# Patient Record
Sex: Male | Born: 1965 | Race: White | Hispanic: No | Marital: Single | Smoking: Current every day smoker
Health system: Southern US, Community
[De-identification: ages and names within clinical notes are randomized; demographics above are authoritative.]

---

## 2004-11-27 ENCOUNTER — Emergency Department (HOSPITAL_COMMUNITY): Admission: EM | Admit: 2004-11-27 | Discharge: 2004-11-27 | Payer: Self-pay | Admitting: Emergency Medicine

## 2006-04-22 ENCOUNTER — Emergency Department (HOSPITAL_COMMUNITY): Admission: EM | Admit: 2006-04-22 | Discharge: 2006-04-22 | Payer: Self-pay | Admitting: Emergency Medicine

## 2009-11-01 ENCOUNTER — Inpatient Hospital Stay (HOSPITAL_COMMUNITY): Admission: EM | Admit: 2009-11-01 | Discharge: 2009-11-03 | Payer: Self-pay | Admitting: Emergency Medicine

## 2010-07-12 LAB — COMPREHENSIVE METABOLIC PANEL
ALT: 16 U/L (ref 0–53)
AST: 20 U/L (ref 0–37)
Albumin: 2.8 g/dL — ABNORMAL LOW (ref 3.5–5.2)
Alkaline Phosphatase: 72 U/L (ref 39–117)
Calcium: 8.1 mg/dL — ABNORMAL LOW (ref 8.4–10.5)
Chloride: 106 mEq/L (ref 96–112)
Chloride: 107 mEq/L (ref 96–112)
Creatinine, Ser: 0.72 mg/dL (ref 0.4–1.5)
GFR calc Af Amer: >60 mL/min (ref >60)
GFR calc non Af Amer: >60 mL/min (ref >60)
Glucose, Bld: 98 mg/dL (ref 70–99)
Potassium: 3.3 mEq/L — ABNORMAL LOW (ref 3.5–5.1)
Sodium: 139 mEq/L (ref 135–145)
Total Bilirubin: 0.5 mg/dL (ref 0.3–1.2)
Total Protein: 5.5 g/dL — ABNORMAL LOW (ref 6.0–8.3)
Total Protein: 7.1 g/dL (ref 6.0–8.3)

## 2010-07-12 LAB — CBC
HCT: 30.5 % — ABNORMAL LOW (ref 39.0–52.0)
HCT: 37.8 % — ABNORMAL LOW (ref 39.0–52.0)
HCT: 42.8 % (ref 39.0–52.0)
Hemoglobin: 10.5 g/dL — ABNORMAL LOW (ref 13.0–17.0)
Hemoglobin: 10.6 g/dL — ABNORMAL LOW (ref 13.0–17.0)
Hemoglobin: 15 g/dL (ref 13.0–17.0)
MCH: 35.3 pg — ABNORMAL HIGH (ref 26.0–34.0)
MCHC: 34.4 g/dL (ref 30.0–36.0)
MCHC: 34.9 g/dL (ref 30.0–36.0)
MCHC: 34.9 g/dL (ref 30.0–36.0)
MCHC: 35.5 g/dL (ref 30.0–36.0)
MCV: 100.4 fL — ABNORMAL HIGH (ref 78.0–100.0)
MCV: 100.9 fL — ABNORMAL HIGH (ref 78.0–100.0)
MCV: 101.2 fL — ABNORMAL HIGH (ref 78.0–100.0)
Platelets: 167 10*3/uL (ref 150–400)
Platelets: 198 10*3/uL (ref 150–400)
Platelets: 208 10*3/uL (ref 150–400)
RBC: 3.73 MIL/uL — ABNORMAL LOW (ref 4.22–5.81)
RBC: 4.27 MIL/uL (ref 4.22–5.81)
RDW: 13.2 % (ref 11.5–15.5)
RDW: 13.3 % (ref 11.5–15.5)
RDW: 13.4 % (ref 11.5–15.5)
WBC: 12.3 10*3/uL — ABNORMAL HIGH (ref 4.0–10.5)

## 2010-07-12 LAB — TYPE AND SCREEN: Antibody Screen: NEGATIVE

## 2010-07-12 LAB — POCT I-STAT, CHEM 8
Calcium, Ion: 1.02 mmol/L — ABNORMAL LOW (ref 1.12–1.32)
TCO2: 23 mmol/L (ref 0–100)

## 2010-07-12 LAB — BASIC METABOLIC PANEL
Calcium: 7.7 mg/dL — ABNORMAL LOW (ref 8.4–10.5)
Chloride: 106 mEq/L (ref 96–112)
Glucose, Bld: 142 mg/dL — ABNORMAL HIGH (ref 70–99)
Potassium: 3.8 mEq/L (ref 3.5–5.1)
Sodium: 136 mEq/L (ref 135–145)

## 2010-07-12 LAB — URINALYSIS, ROUTINE W REFLEX MICROSCOPIC
Bilirubin Urine: NEGATIVE
Glucose, UA: NEGATIVE mg/dL
pH: 5.5 (ref 5.0–8.0)

## 2010-07-12 LAB — APTT: aPTT: 29 seconds (ref 24–37)

## 2010-07-12 LAB — URINE MICROSCOPIC-ADD ON

## 2010-07-12 LAB — LACTIC ACID, PLASMA: Lactic Acid, Venous: 2 mmol/L (ref 0.5–2.2)

## 2010-07-12 LAB — ETHANOL: Alcohol, Ethyl (B): 229 mg/dL — ABNORMAL HIGH (ref 0–10)

## 2010-07-12 LAB — MRSA PCR SCREENING: MRSA by PCR: NEGATIVE

## 2012-12-27 ENCOUNTER — Emergency Department (HOSPITAL_COMMUNITY)
Admission: EM | Admit: 2012-12-27 | Discharge: 2012-12-27 | Disposition: A | Discharge status: Home / Self Care | Payer: Self-pay | Attending: Emergency Medicine | Admitting: Emergency Medicine

## 2012-12-27 ENCOUNTER — Encounter (HOSPITAL_COMMUNITY): Payer: Self-pay

## 2012-12-27 DIAGNOSIS — L0291 Cutaneous abscess, unspecified: Secondary | ICD-10-CM

## 2012-12-27 DIAGNOSIS — L02419 Cutaneous abscess of limb, unspecified: Secondary | ICD-10-CM | POA: Insufficient documentation

## 2012-12-27 DIAGNOSIS — IMO0002 Reserved for concepts with insufficient information to code with codable children: Secondary | ICD-10-CM | POA: Insufficient documentation

## 2012-12-27 DIAGNOSIS — R21 Rash and other nonspecific skin eruption: Secondary | ICD-10-CM | POA: Insufficient documentation

## 2012-12-27 DIAGNOSIS — F17200 Nicotine dependence, unspecified, uncomplicated: Secondary | ICD-10-CM | POA: Insufficient documentation

## 2012-12-27 MED ORDER — KETOROLAC TROMETHAMINE 10 MG PO TABS
10.0000 mg | ORAL_TABLET | Freq: Once | ORAL | Status: AC
  Administered 2012-12-27: 10 mg via ORAL
  Filled 2012-12-27: qty 1

## 2012-12-27 MED ORDER — DOXYCYCLINE HYCLATE 100 MG PO CAPS: 100.0000 mg | ORAL_CAPSULE | Freq: Two times a day (BID) | ORAL | Status: AC

## 2012-12-27 MED ORDER — DOXYCYCLINE HYCLATE 100 MG PO TABS
100.0000 mg | ORAL_TABLET | Freq: Once | ORAL | Status: AC
  Administered 2012-12-27: 100 mg via ORAL
  Filled 2012-12-27: qty 1

## 2012-12-27 MED ORDER — TRIAMCINOLONE ACETONIDE 0.1 % EX CREA: TOPICAL_CREAM | Freq: Two times a day (BID) | CUTANEOUS | Status: DC

## 2012-12-27 MED ORDER — CEFTRIAXONE SODIUM 1 G IJ SOLR
1.0000 g | Freq: Once | INTRAMUSCULAR | Status: AC
  Administered 2012-12-27: 1 g via INTRAMUSCULAR
  Filled 2012-12-27: qty 10

## 2012-12-27 MED ORDER — CEPHALEXIN 500 MG PO CAPS: 500.0000 mg | ORAL_CAPSULE | Freq: Four times a day (QID) | ORAL | Status: DC

## 2012-12-27 MED ORDER — HYDROCODONE-ACETAMINOPHEN 5-325 MG PO TABS
ORAL_TABLET | ORAL | Status: AC
  Filled 2012-12-27: qty 1

## 2012-12-27 MED ORDER — HYDROCODONE-ACETAMINOPHEN 5-325 MG PO TABS
2.0000 | ORAL_TABLET | Freq: Once | ORAL | Status: AC
  Administered 2012-12-27: 2 via ORAL
  Filled 2012-12-27: qty 2

## 2012-12-27 MED ORDER — HYDROCODONE-ACETAMINOPHEN 7.5-325 MG PO TABS: 1.0000 | ORAL_TABLET | ORAL | Status: DC | PRN

## 2012-12-27 MED ORDER — LIDOCAINE HCL (PF) 1 % IJ SOLN
INTRAMUSCULAR | Status: AC
  Administered 2012-12-27: 2.1 mL
  Filled 2012-12-27: qty 5

## 2012-12-27 MED ORDER — ONDANSETRON HCL 4 MG PO TABS
4.0000 mg | ORAL_TABLET | Freq: Once | ORAL | Status: AC
  Administered 2012-12-27: 4 mg via ORAL
  Filled 2012-12-27: qty 1

## 2012-12-27 NOTE — ED Notes (Signed)
Pt reports 2 abcesses on e on left forearm and one to right uppper hip area. For 2 days. Unable to sleep d/t pain, areas are not draining, also has ringworm to right forearm

## 2012-12-27 NOTE — ED Provider Notes (Signed)
CSN: [id number]     Arrival date & time 12/27/12  0957 History   First MD Initiated Contact with Patient 12/27/12 1030     Chief Complaint  Patient presents with  . Wound Check   (Consider location/radiation/quality/duration/timing/severity/associated sxs/prior Treatment) Patient is a 47 y.o. male presenting with wound check. The history is provided by the patient.  Wound Check This is a new problem. The current episode started in the past 7 days. The problem occurs constantly. The problem has been gradually worsening. Pertinent negatives include no abdominal pain, arthralgias, chest pain, coughing, fever, nausea, neck pain or vomiting. Nothing aggravates the symptoms. He has tried acetaminophen and NSAIDs for the symptoms. The treatment provided no relief.    History reviewed. No pertinent past medical history. History reviewed. No pertinent past surgical history. No family history on file. History  Substance Use Topics  . Smoking status: Current Every Day Smoker  . Smokeless tobacco: Not on file  . Alcohol Use: Yes     Comment: socailly    Review of Systems  Constitutional: Negative for fever and activity change.       All ROS Neg except as noted in HPI  HENT: Negative for nosebleeds and neck pain.   Eyes: Negative for photophobia and discharge.  Respiratory: Negative for cough, shortness of breath and wheezing.   Cardiovascular: Negative for chest pain and palpitations.  Gastrointestinal: Negative for nausea, vomiting, abdominal pain and blood in stool.  Genitourinary: Negative for dysuria, frequency and hematuria.  Musculoskeletal: Negative for back pain and arthralgias.  Skin: Negative.   Neurological: Negative for dizziness, seizures and speech difficulty.  Psychiatric/Behavioral: Negative for hallucinations and confusion.    Allergies  Review of patient's allergies indicates no known allergies.  Home Medications   Current Outpatient Rx  Name  Route  Sig  Dispense   Refill  . acetaminophen (TYLENOL) 500 MG tablet   Oral   Take 1,000 mg by mouth every 6 (six) hours as needed for pain.         Marland Kitchen ibuprofen (ADVIL,MOTRIN) 200 MG tablet   Oral   Take 400 mg by mouth every 6 (six) hours as needed for pain.          BP 122/77  Pulse 87  Temp(Src) 98.6 F (37 C) (Oral)  Resp 18  Ht 5\' 3"  (1.6 m)  Wt 135 lb (61.236 kg)  BMI 23.92 kg/m2  SpO2 99% Physical Exam  Nursing note and vitals reviewed. Constitutional: He is oriented to person, place, and time. He appears well-developed and well-nourished.  Non-toxic appearance.  HENT:  Head: Normocephalic.  Right Ear: Tympanic membrane and external ear normal.  Left Ear: Tympanic membrane and external ear normal.  Eyes: EOM and lids are normal. Pupils are equal, round, and reactive to light.  Neck: Normal range of motion. Neck supple. Carotid bruit is not present.  Cardiovascular: Normal rate, regular rhythm, normal heart sounds, intact distal pulses and normal pulses.   Pulmonary/Chest: Breath sounds normal. No respiratory distress.  Abdominal: Soft. Bowel sounds are normal. There is no tenderness. There is no guarding.  Musculoskeletal: Normal range of motion.  3x2 cm abscess of the left forearm. 3.5x4cm abscess of the right thigh.. Inguinal lymph nodes palpable and sore.  Lymphadenopathy:       Head (right side): No submandibular adenopathy present.       Head (left side): No submandibular adenopathy present.    He has no cervical adenopathy.  Neurological: He is alert  and oriented to person, place, and time. He has normal strength. No cranial nerve deficit or sensory deficit.  Skin: Skin is warm and dry.  Psychiatric: He has a normal mood and affect. His speech is normal.    ED Course  Procedures (including critical care time) Labs Review Labs Reviewed - No data to display Imaging Review No results found.  MDM  No diagnosis found. **I have reviewed nursing notes, vital signs, and all  appropriate lab and imaging results for this patient.*  Pt has abscess area of the left forearm and right upper hip. No temp elevation. Pt treated with rocephin and doxycycline. She has keflex and septra at home. Pt will start warm tub soaks. Pt is ambulatory without problem.  Kathie Dike, PA-C 12/27/12 1620

## 2012-12-28 NOTE — ED Provider Notes (Signed)
Medical screening examination/treatment/procedure(s) were performed by non-physician practitioner and as supervising physician I was immediately available for consultation/collaboration.   Benny Lennert, MD 12/28/12 [phone]

## 2013-02-17 ENCOUNTER — Emergency Department (HOSPITAL_COMMUNITY)
Admission: EM | Admit: 2013-02-17 | Discharge: 2013-02-17 | Disposition: A | Discharge status: Home / Self Care | Payer: Medicaid Other | Attending: Emergency Medicine | Admitting: Emergency Medicine

## 2013-02-17 ENCOUNTER — Emergency Department (HOSPITAL_COMMUNITY): Payer: Medicaid Other

## 2013-02-17 ENCOUNTER — Encounter (HOSPITAL_COMMUNITY): Payer: Self-pay | Admitting: Emergency Medicine

## 2013-02-17 DIAGNOSIS — L739 Follicular disorder, unspecified: Secondary | ICD-10-CM

## 2013-02-17 DIAGNOSIS — R079 Chest pain, unspecified: Secondary | ICD-10-CM | POA: Insufficient documentation

## 2013-02-17 DIAGNOSIS — F17200 Nicotine dependence, unspecified, uncomplicated: Secondary | ICD-10-CM | POA: Insufficient documentation

## 2013-02-17 DIAGNOSIS — H60399 Other infective otitis externa, unspecified ear: Secondary | ICD-10-CM | POA: Insufficient documentation

## 2013-02-17 DIAGNOSIS — Z79899 Other long term (current) drug therapy: Secondary | ICD-10-CM | POA: Insufficient documentation

## 2013-02-17 DIAGNOSIS — H6011 Cellulitis of right external ear: Secondary | ICD-10-CM

## 2013-02-17 DIAGNOSIS — R209 Unspecified disturbances of skin sensation: Secondary | ICD-10-CM | POA: Insufficient documentation

## 2013-02-17 DIAGNOSIS — L738 Other specified follicular disorders: Secondary | ICD-10-CM | POA: Insufficient documentation

## 2013-02-17 LAB — CBC WITH DIFFERENTIAL/PLATELET
Basophils Absolute: 0 10*3/uL (ref 0.0–0.1)
HCT: 43.7 % (ref 39.0–52.0)
Lymphocytes Relative: 36 % (ref 12–46)
Monocytes Absolute: 0.8 10*3/uL (ref 0.1–1.0)
Neutro Abs: 5.1 10*3/uL (ref 1.7–7.7)
Platelets: 232 10*3/uL (ref 150–400)
RDW: 12.6 % (ref 11.5–15.5)
WBC: 9.3 10*3/uL (ref 4.0–10.5)

## 2013-02-17 LAB — BASIC METABOLIC PANEL
CO2: 27 mEq/L (ref 19–32)
Chloride: 99 mEq/L (ref 96–112)
Sodium: 137 mEq/L (ref 135–145)

## 2013-02-17 LAB — TROPONIN I: Troponin I: <0.3 ng/mL (ref <0.30)

## 2013-02-17 MED ORDER — SULFAMETHOXAZOLE-TMP DS 800-160 MG PO TABS
1.0000 | ORAL_TABLET | Freq: Once | ORAL | Status: AC
  Administered 2013-02-17: 1 via ORAL
  Filled 2013-02-17: qty 1

## 2013-02-17 MED ORDER — FAMOTIDINE 20 MG PO TABS: 20.0000 mg | ORAL_TABLET | Freq: Two times a day (BID) | ORAL | Status: DC

## 2013-02-17 MED ORDER — HYDROCODONE-ACETAMINOPHEN 5-325 MG PO TABS
2.0000 | ORAL_TABLET | Freq: Once | ORAL | Status: AC
  Administered 2013-02-17: 2 via ORAL
  Filled 2013-02-17: qty 2

## 2013-02-17 MED ORDER — SULFAMETHOXAZOLE-TRIMETHOPRIM 800-160 MG PO TABS: 1.0000 | ORAL_TABLET | Freq: Two times a day (BID) | ORAL | Status: DC

## 2013-02-17 NOTE — ED Notes (Addendum)
Pt c/o pain, redness to right ear lobe area, is concerned that he may have been bitten by an insect, states that the pain started three days ago, denies any drainage,  'pt also wanted his "lower area" checked, states that he was seen for rash to penis area recently, states that the rash is not any better and he feels like he is getting "knots" around his pubic hairline, Pt c/o left side chest pain for the past 5 days, pain is described as a stabbing sensation, has been having chills during the night, sob, nausea, pain has been intermittent.

## 2013-02-17 NOTE — ED Provider Notes (Signed)
CSN: [id number]     Arrival date & time 02/17/13  1522 History  This chart was scribed for Vida Roller, MD by Bennett Scrape, ED Scribe. This patient was seen in room APA19/APA19 and the patient's care was started at 3:41 PM.   Chief Complaint  Patient presents with  . Otalgia  . Chest Pain    The history is provided by the patient. No language interpreter was used.    HPI Comments: Jack Mckenzie is a 47 y.o. male who presents to the Emergency Department complaining of right otalgia to the upper ear - helix- that started 2 days ago. He states that the tip of the ear has been turning color today. He denies any increased hearing or involvement in the left ear. He denies any recent insect bites or traumas. Pt admits that he had one alcoholic beverage on the way to the ED.  He also reports multiple acute abscesses starting 2 months ago. He states that he has had a home I and D of the R hip - resolved. Pt states that he has a rash that is now "lumps and bumps". He was seen recently for the same that was treated with antibiotics.  Rash is in the groin in the suprapubic area and pubic line.  Denies penile d/c or pain or testicular pain  He states that he has also experienced chills and fevers. Right arm goes numb including all of his right fingers while sleeping. He states that the symptoms improve with flipping over in bed which causes the L arm to have the same sx.  He states that he has been having recurrent CP "for a long time, since 2001". He states that he has pain daily but has never been evaluated. He admits that the pain seems to be linked to stress. 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    History reviewed. No pertinent past medical history. History reviewed. No pertinent past surgical history. No family history on file. History  Substance Use Topics  . Smoking status: Current Every Day Smoker  . Smokeless  tobacco: Not on file  . Alcohol Use: Yes     Comment: socailly    Review of Systems  HENT: Positive for ear pain. Negative for hearing loss.   Cardiovascular: Positive for chest pain.  Skin: Positive for rash.  Neurological: Positive for numbness.  All other systems reviewed and are negative.    Allergies  Review of patient's allergies indicates no known allergies.  Home Medications   Current Outpatient Rx  Name  Route  Sig  Dispense  Refill  . famotidine (PEPCID) 20 MG tablet   Oral   Take 1 tablet (20 mg total) by mouth 2 (two) times daily.   30 tablet   0   . sulfamethoxazole-trimethoprim (SEPTRA DS) 800-160 MG per tablet   Oral   Take 1 tablet by mouth every 12 (twelve) hours.   20 tablet   0    Triage Vitals: BP 98/60  Pulse 70  Temp(Src) 98.4 F (36.9 C) (Oral)  Resp 18  SpO2 95%  Physical Exam  Nursing note and vitals reviewed. Constitutional: He is oriented to person, place, and time. He appears well-developed and well-nourished. No distress.  HENT:  Head: Normocephalic and atraumatic.  Upper auricle and helix has some erythema and induration with a central area that appears necrotic, 3 mm in diameter, no drainage, no pain with manipulation  of the tragus  Eyes: EOM are normal.  Neck: Neck supple. No tracheal deviation present.  Cardiovascular: Normal rate and regular rhythm.   Pulmonary/Chest: Effort normal and breath sounds normal. No respiratory distress.  Abdominal: Soft. He exhibits no distension. There is no tenderness.  Genitourinary:  5 isolated discrete areas of erythematous nodular 1 mm in diameter with slight lymphadenopathy on the left and R on the pubic hair line , no penile discharge, normal testicles and scrotum   Musculoskeletal: Normal range of motion.  Neurological: He is alert and oriented to person, place, and time.  Skin: Skin is warm and dry.  Auricle on the R ear with erythema and induration - small folliculitis around the pubic  hair as above, no other acute findings.  Psychiatric: He has a normal mood and affect. His behavior is normal.    ED Course  Procedures (including critical care time)  DIAGNOSTIC STUDIES: Oxygen Saturation is 95% on room air, adequate by my interpretation.    COORDINATION OF CARE: 3:48 PM-Discussed treatment plan which includes (CXR, CBC panel, CMP, UA) with pt at bedside and pt agreed to plan.   Labs Review Labs Reviewed  CBC WITH DIFFERENTIAL - Abnormal; Notable for the following:    MCH 34.1 (*)    All other components within normal limits  TROPONIN I  BASIC METABOLIC PANEL   Imaging Review Dg Chest 2 View  02/17/2013   CLINICAL DATA:  Chest pain  EXAM: CHEST  2 VIEW  COMPARISON:  October 31, 2009  FINDINGS: There is mild left base atelectasis. Lungs are otherwise clear. Heart size and pulmonary vascularity are normal. No adenopathy. No pneumothorax. No bone lesions. There is an old healed fracture of the posterior right 10th rib.  IMPRESSION: Mild left base atelectasis. Lungs otherwise clear.   Electronically Signed   By: Bretta Bang M.D.   On: 02/17/2013 16:25    EKG Interpretation     Ventricular Rate:  88 PR Interval:  142 QRS Duration: 88 QT Interval:  356 QTC Calculation: 430 R Axis:   77 Text Interpretation:  Normal sinus rhythm with sinus arrhythmia Normal ECG No previous ECGs available            MDM   1. Cellulitis of ear, right   2. Folliculitis   3. Chest pain    Pt is HD stable - has no light headedness, no sob, no cough but has a chronic CP which has been evaluated at this time with ECG and will add CXR and labs - his skin exams are more c/w a cellulitis of both the ear which may or may note be related to an insect bite - no involvement of the EAC or TM which are normal.  He has small folliciulitis of the pubic hair of the Genitals but are not draining and only 1mm in diameter making them too small to I and D.  Abx, - Bactrim  Antibiotics  given, EKG unremarkable, labs also normal, patient will be given followup list of family doctors, see prescriptions below. Doubt cardiac etiology of chest pain  Meds given in ED:  Medications  sulfamethoxazole-trimethoprim (BACTRIM DS) 800-160 MG per tablet 1 tablet (1 tablet Oral Given 02/17/13 1613)  HYDROcodone-acetaminophen (NORCO/VICODIN) 5-325 MG per tablet 2 tablet (2 tablets Oral Given 02/17/13 1613)    New Prescriptions   FAMOTIDINE (PEPCID) 20 MG TABLET    Take 1 tablet (20 mg total) by mouth 2 (two) times daily.   SULFAMETHOXAZOLE-TRIMETHOPRIM (SEPTRA  DS) 800-160 MG PER TABLET    Take 1 tablet by mouth every 12 (twelve) hours.      I personally performed the services described in this documentation, which was scribed in my presence. The recorded information has been reviewed and is accurate.       Vida Roller, MD 02/17/13 [phone]

## 2013-02-17 NOTE — ED Notes (Signed)
MD at bedside. 

## 2013-07-09 ENCOUNTER — Emergency Department (HOSPITAL_COMMUNITY)
Admission: EM | Admit: 2013-07-09 | Discharge: 2013-07-10 | Disposition: A | Discharge status: Home / Self Care | Payer: Medicaid Other | Attending: Emergency Medicine | Admitting: Emergency Medicine

## 2013-07-09 ENCOUNTER — Encounter (HOSPITAL_COMMUNITY): Payer: Self-pay | Admitting: Emergency Medicine

## 2013-07-09 DIAGNOSIS — S61509A Unspecified open wound of unspecified wrist, initial encounter: Secondary | ICD-10-CM | POA: Insufficient documentation

## 2013-07-09 DIAGNOSIS — Y939 Activity, unspecified: Secondary | ICD-10-CM | POA: Insufficient documentation

## 2013-07-09 DIAGNOSIS — Z23 Encounter for immunization: Secondary | ICD-10-CM | POA: Insufficient documentation

## 2013-07-09 DIAGNOSIS — Y929 Unspecified place or not applicable: Secondary | ICD-10-CM | POA: Insufficient documentation

## 2013-07-09 DIAGNOSIS — F17200 Nicotine dependence, unspecified, uncomplicated: Secondary | ICD-10-CM | POA: Insufficient documentation

## 2013-07-09 DIAGNOSIS — F10929 Alcohol use, unspecified with intoxication, unspecified: Secondary | ICD-10-CM

## 2013-07-09 DIAGNOSIS — F1010 Alcohol abuse, uncomplicated: Secondary | ICD-10-CM | POA: Insufficient documentation

## 2013-07-09 DIAGNOSIS — Z79899 Other long term (current) drug therapy: Secondary | ICD-10-CM | POA: Insufficient documentation

## 2013-07-09 DIAGNOSIS — W268XXA Contact with other sharp object(s), not elsewhere classified, initial encounter: Secondary | ICD-10-CM | POA: Insufficient documentation

## 2013-07-09 DIAGNOSIS — S61512A Laceration without foreign body of left wrist, initial encounter: Secondary | ICD-10-CM

## 2013-07-09 DIAGNOSIS — IMO0001 Reserved for inherently not codable concepts without codable children: Secondary | ICD-10-CM | POA: Insufficient documentation

## 2013-07-09 MED ORDER — LIDOCAINE HCL (PF) 1 % IJ SOLN
10.0000 mg | Freq: Once | INTRAMUSCULAR | Status: AC
  Administered 2013-07-10: 01:00:00
  Filled 2013-07-09: qty 5
  Filled 2013-07-09: qty 10

## 2013-07-09 MED ORDER — TETANUS-DIPHTH-ACELL PERTUSSIS 5-2.5-18.5 LF-MCG/0.5 IM SUSP
0.5000 mL | Freq: Once | INTRAMUSCULAR | Status: AC
  Administered 2013-07-10: 0.5 mL via INTRAMUSCULAR
  Filled 2013-07-09: qty 0.5

## 2013-07-09 NOTE — ED Provider Notes (Signed)
CSN: [id number]     Arrival date & time 07/09/13  2352 History  This chart was scribed for Dione Boozeavid Azya Barbero, MD by Dorothey Basemania Sutton, ED Scribe. This patient was seen in room APA03/APA03 and the patient's care was started at 11:53 PM.    Chief Complaint  Patient presents with  . Extremity Laceration   The history is provided by the patient. No language interpreter was used.   HPI Comments: Jack Mckenzie is a 48 y.o. Male brought in by EMS who presents to the Emergency Department complaining of a laceration to the left wrist that he sustained about 7 hours ago on a nail. Patient was unable to control the bleeding at home and EMS noted a large amount of blood at the scene. A dressing was applied to the area PTA by EMS en route to the ED and the bleeding is well-controlled at this time. He reports an associated pain to the area, 7-8/10 currently, that is exacerbated with touch/applied pressure and movement. Patient presents to the ED intoxicated and admits to consuming two 40 oz beers tonight. He does not remember when his last tetanus vaccination was. He denies anticoagulant medication use. Patient has no other pertinent medical history.   History reviewed. No pertinent past medical history. History reviewed. No pertinent past surgical history. History reviewed. No pertinent family history. History  Substance Use Topics  . Smoking status: Current Every Day Smoker -- 1.00 packs/day    Types: Cigarettes  . Smokeless tobacco: Not on file  . Alcohol Use: Yes     Comment: socailly    Review of Systems  Musculoskeletal: Positive for myalgias.  Skin: Positive for wound.  All other systems reviewed and are negative.    Allergies  Review of patient's allergies indicates no known allergies.  Home Medications   Current Outpatient Rx  Name  Route  Sig  Dispense  Refill  . famotidine (PEPCID) 20 MG tablet   Oral   Take 1 tablet (20 mg total) by mouth 2 (two) times daily.   30 tablet   0   .  sulfamethoxazole-trimethoprim (SEPTRA DS) 800-160 MG per tablet   Oral   Take 1 tablet by mouth every 12 (twelve) hours.   20 tablet   0    Triage Vitals: BP 111/80  Pulse 80  Temp(Src) 97.8 F (36.6 C) (Oral)  Resp 20  Ht 5\' 4"  (1.626 m)  Wt 140 lb (63.504 kg)  BMI 24.02 kg/m2  SpO2 97%  Physical Exam  Nursing note and vitals reviewed. Constitutional: He is oriented to person, place, and time. He appears well-developed and well-nourished. No distress.  HENT:  Head: Normocephalic and atraumatic.  Eyes: Conjunctivae are normal.  Neck: Normal range of motion. Neck supple.  Pulmonary/Chest: Effort normal. No respiratory distress.  Abdominal: He exhibits no distension.  Musculoskeletal:  3 cm laceration across the dorsum of the left wrist. Weakness of extension of the 2nd, 3rd, and 4th fingers. Decreased sensation over the dorsum of the left hand. No apparent tendon or nerve involvement.   Neurological: He is alert and oriented to person, place, and time.  Slurred speech consistent with alcohol intoxication.   Skin: Skin is warm and dry.  Psychiatric: He has a normal mood and affect. His behavior is normal.    ED Course  Procedures (including critical care time)  DIAGNOSTIC STUDIES: Oxygen Saturation is 97% on room air, normal by my interpretation.    COORDINATION OF CARE: 11:57 PM- Discussed that the wound  be explored and repaired with sutures. Discussed treatment plan with patient at bedside and patient verbalized agreement.   12:18 AM- Repaired laceration. Will discharge patient with a splint to help immobilize the area. Advised patient to take Tylenol and ibuprofen at home to manage pain symptoms. Advised patient to follow up with the referred hand specialist. Discussed treatment plan with patient at bedside and patient verbalized agreement.    LACERATION REPAIR PROCEDURE NOTE The patient's identification was confirmed and consent was obtained. This procedure was  performed by Dione Booze, MD at 12:01 AM. Site: dorsum of distal left wrist Sterile procedures observed Anesthetic used (type and amt):1% lidocaine without epinephrine, 4 cc Suture type/size:4.0 Nylon Length: 3 cm  # of Sutures: 5 Technique: simple interrupted Complexity: simple Antibx ointment applied Tetanus ordered Site anesthetized, irrigated with NS, explored without evidence of foreign body quarter tendon or nerve injury, wound well approximated, site covered with dry, sterile dressing.  Patient tolerated procedure well without complications. Instructions for care discussed verbally and patient provided with additional written instructions for homecare and f/u.    MDM   Final diagnoses:  Laceration of left wrist without complication  Alcohol intoxication    Wrist laceration with physical findings concerning for possible tendon/nerve injury. However, after careful exploration, the wound was found to be rather superficial and not involving any deep structures. Wounds are closed and he was given Tdap booster. He is referred to hand surgery for a followup in 2-3 days to make sure there is not an occult tendon or nerve injury.  I personally performed the services described in this documentation, which was scribed in my presence. The recorded information has been reviewed and is accurate.      Dione Booze, MD 07/10/13 [phone]

## 2013-07-09 NOTE — ED Notes (Addendum)
Pt arrived via EMS related to laceration to left wrist unable to control bleeding at home. Dressing intact dime size bleed through noted. Ems states large amount blood noted on scene. Pt denies blood thinners. Smells of etoh admits to 2 40s today. Alert x4 respirations easy non labored. 500 bolus given in route related to b/p

## 2013-07-10 NOTE — Discharge Instructions (Signed)
Your exam did not show any nerve or tendon injury, but you should be rechecked to be sure. Sutures need to be removed in seven days - that canbe done here, at an urgent care center, or at your doctor's office. Take acetaminophen or ibuprofen as needed for pain. Wear the splint as needed.  Laceration Care, Adult A laceration is a cut or lesion that goes through all layers of the skin and into the tissue just beneath the skin. TREATMENT  Some lacerations may not require closure. Some lacerations may not be able to be closed due to an increased risk of infection. It is important to see your caregiver as soon as possible after an injury to minimize the risk of infection and maximize the opportunity for successful closure. If closure is appropriate, pain medicines may be given, if needed. The wound will be cleaned to help prevent infection. Your caregiver will use stitches (sutures), staples, wound glue (adhesive), or skin adhesive strips to repair the laceration. These tools bring the skin edges together to allow for faster healing and a better cosmetic outcome. However, all wounds will heal with a scar. Once the wound has healed, scarring can be minimized by covering the wound with sunscreen during the day for 1 full year. HOME CARE INSTRUCTIONS  For sutures or staples:  Keep the wound clean and dry.  If you were given a bandage (dressing), you should change it at least once a day. Also, change the dressing if it becomes wet or dirty, or as directed by your caregiver.  Wash the wound with soap and water 2 times a day. Rinse the wound off with water to remove all soap. Pat the wound dry with a clean towel.  After cleaning, apply a thin layer of the antibiotic ointment as recommended by your caregiver. This will help prevent infection and keep the dressing from sticking.  You may shower as usual after the first 24 hours. Do not soak the wound in water until the sutures are removed.  Only take  over-the-counter or prescription medicines for pain, discomfort, or fever as directed by your caregiver.  Get your sutures or staples removed as directed by your caregiver. For skin adhesive strips:  Keep the wound clean and dry.  Do not get the skin adhesive strips wet. You may bathe carefully, using caution to keep the wound dry.  If the wound gets wet, pat it dry with a clean towel.  Skin adhesive strips will fall off on their own. You may trim the strips as the wound heals. Do not remove skin adhesive strips that are still stuck to the wound. They will fall off in time. For wound adhesive:  You may briefly wet your wound in the shower or bath. Do not soak or scrub the wound. Do not swim. Avoid periods of heavy perspiration until the skin adhesive has fallen off on its own. After showering or bathing, gently pat the wound dry with a clean towel.  Do not apply liquid medicine, cream medicine, or ointment medicine to your wound while the skin adhesive is in place. This may loosen the film before your wound is healed.  If a dressing is placed over the wound, be careful not to apply tape directly over the skin adhesive. This may cause the adhesive to be pulled off before the wound is healed.  Avoid prolonged exposure to sunlight or tanning lamps while the skin adhesive is in place. Exposure to ultraviolet light in the first year will  darken the scar.  The skin adhesive will usually remain in place for 5 to 10 days, then naturally fall off the skin. Do not pick at the adhesive film. You may need a tetanus shot if:  You cannot remember when you had your last tetanus shot.  You have never had a tetanus shot. If you get a tetanus shot, your arm may swell, get red, and feel warm to the touch. This is common and not a problem. If you need a tetanus shot and you choose not to have one, there is a rare chance of getting tetanus. Sickness from tetanus can be serious. SEEK MEDICAL CARE IF:   You  have redness, swelling, or increasing pain in the wound.  You see a red line that goes away from the wound.  You have yellowish-white fluid (pus) coming from the wound.  You have a fever.  You notice a bad smell coming from the wound or dressing.  Your wound breaks open before or after sutures have been removed.  You notice something coming out of the wound such as wood or glass.  Your wound is on your hand or foot and you cannot move a finger or toe. SEEK IMMEDIATE MEDICAL CARE IF:   Your pain is not controlled with prescribed medicine.  You have severe swelling around the wound causing pain and numbness or a change in color in your arm, hand, leg, or foot.  Your wound splits open and starts bleeding.  You have worsening numbness, weakness, or loss of function of any joint around or beyond the wound.  You develop painful lumps near the wound or on the skin anywhere on your body. MAKE SURE YOU:   Understand these instructions.  Will watch your condition.  Will get help right away if you are not doing well or get worse. Document Released: 04/12/2005 Document Revised: 07/05/2011 Document Reviewed: 10/06/2010 [facility] Patient Information 2014 [place], [place].   Alcohol Intoxication Alcohol intoxication occurs when the amount of alcohol that a person has consumed impairs his or her ability to mentally and physically function. Alcohol directly impairs the normal chemical activity of the brain. Drinking large amounts of alcohol can lead to changes in mental function and behavior, and it can cause many physical effects that can be harmful.  Alcohol intoxication can range in severity from mild to very severe. Various factors can affect the level of intoxication that occurs, such as the person's age, gender, weight, frequency of alcohol consumption, and the presence of other medical conditions (such as diabetes, seizures, or heart conditions). Dangerous levels of alcohol intoxication  may occur when people drink large amounts of alcohol in a short period (binge drinking). Alcohol can also be especially dangerous when combined with certain prescription medicines or "recreational" drugs. SIGNS AND SYMPTOMS Some common signs and symptoms of mild alcohol intoxication include:  Loss of coordination.  Changes in mood and behavior.  Impaired judgment.  Slurred speech. As alcohol intoxication progresses to more severe levels, other signs and symptoms will appear. These may include:  Vomiting.  Confusion and impaired memory.  Slowed breathing.  Seizures.  Loss of consciousness. DIAGNOSIS  Your health care provider will take a medical history and perform a physical exam. You will be asked about the amount and type of alcohol you have consumed. Blood tests will be done to measure the concentration of alcohol in your blood. In many places, your blood alcohol level must be lower than 80 mg/dL (1.61%) to legally drive. However,  many dangerous effects of alcohol can occur at much lower levels.  TREATMENT  People with alcohol intoxication often do not require treatment. Most of the effects of alcohol intoxication are temporary, and they go away as the alcohol naturally leaves the body. Your health care provider will monitor your condition until you are stable enough to go home. Fluids are sometimes given through an IV access tube to help prevent dehydration.  HOME CARE INSTRUCTIONS  Do not drive after drinking alcohol.  Stay hydrated. Drink enough water and fluids to keep your urine clear or pale yellow. Avoid caffeine.   Only take over-the-counter or prescription medicines as directed by your health care provider.  SEEK MEDICAL CARE IF:   You have persistent vomiting.   You do not feel better after a few days.  You have frequent alcohol intoxication. Your health care provider can help determine if you should see a substance use treatment counselor. SEEK IMMEDIATE  MEDICAL CARE IF:   You become shaky or tremble when you try to stop drinking.   You shake uncontrollably (seizure).   You throw up (vomit) blood. This may be bright red or may look like black coffee grounds.   You have blood in your stool. This may be bright red or may appear as a black, tarry, bad smelling stool.   You become lightheaded or faint.  MAKE SURE YOU:   Understand these instructions.  Will watch your condition.  Will get help right away if you are not doing well or get worse. Document Released: 01/20/2005 Document Revised: 12/13/2012 Document Reviewed: 09/15/2012 [facility] Patient Information 2014 [place], [place].

## 2014-12-24 ENCOUNTER — Encounter (HOSPITAL_COMMUNITY): Payer: Self-pay | Admitting: Emergency Medicine

## 2014-12-24 ENCOUNTER — Emergency Department (HOSPITAL_COMMUNITY)
Admission: EM | Admit: 2014-12-24 | Discharge: 2014-12-24 | Discharge status: Against Medical Advice | Payer: Medicaid Other | Attending: Emergency Medicine | Admitting: Emergency Medicine

## 2014-12-24 DIAGNOSIS — R2232 Localized swelling, mass and lump, left upper limb: Secondary | ICD-10-CM | POA: Insufficient documentation

## 2014-12-24 DIAGNOSIS — Z720 Tobacco use: Secondary | ICD-10-CM | POA: Insufficient documentation

## 2014-12-24 NOTE — ED Notes (Signed)
Delay explained to pt and family. Pt seen leaving the Dept. NAD. Pt was told he needed to see a physician for the infection in his arm.

## 2014-12-24 NOTE — ED Notes (Signed)
Cyst to left arm.  Rates pain 8/10.

## 2015-06-16 ENCOUNTER — Encounter (HOSPITAL_COMMUNITY): Payer: Self-pay | Admitting: Emergency Medicine

## 2015-06-16 ENCOUNTER — Emergency Department (HOSPITAL_COMMUNITY)
Admission: EM | Admit: 2015-06-16 | Discharge: 2015-06-16 | Disposition: A | Discharge status: Home / Self Care | Payer: Medicaid Other | Attending: Emergency Medicine | Admitting: Emergency Medicine

## 2015-06-16 DIAGNOSIS — F10120 Alcohol abuse with intoxication, uncomplicated: Secondary | ICD-10-CM | POA: Insufficient documentation

## 2015-06-16 DIAGNOSIS — F1310 Sedative, hypnotic or anxiolytic abuse, uncomplicated: Secondary | ICD-10-CM | POA: Insufficient documentation

## 2015-06-16 DIAGNOSIS — Z7982 Long term (current) use of aspirin: Secondary | ICD-10-CM | POA: Insufficient documentation

## 2015-06-16 DIAGNOSIS — F17210 Nicotine dependence, cigarettes, uncomplicated: Secondary | ICD-10-CM | POA: Insufficient documentation

## 2015-06-16 DIAGNOSIS — F1110 Opioid abuse, uncomplicated: Secondary | ICD-10-CM | POA: Insufficient documentation

## 2015-06-16 DIAGNOSIS — F10929 Alcohol use, unspecified with intoxication, unspecified: Secondary | ICD-10-CM

## 2015-06-16 LAB — COMPREHENSIVE METABOLIC PANEL
ALT: 90 U/L — AB (ref 17–63)
AST: 99 U/L — AB (ref 15–41)
Albumin: 3.5 g/dL (ref 3.5–5.0)
Alkaline Phosphatase: 84 U/L (ref 38–126)
Anion gap: 10 (ref 5–15)
BUN: 8 mg/dL (ref 6–20)
CHLORIDE: 106 mmol/L (ref 101–111)
CO2: 25 mmol/L (ref 22–32)
CREATININE: 0.81 mg/dL (ref 0.61–1.24)
Calcium: 8.3 mg/dL — ABNORMAL LOW (ref 8.9–10.3)
GFR calc non Af Amer: >60 mL/min (ref >60)
Glucose, Bld: 97 mg/dL (ref 65–99)
POTASSIUM: 4.1 mmol/L (ref 3.5–5.1)
SODIUM: 141 mmol/L (ref 135–145)
Total Bilirubin: 0.3 mg/dL (ref 0.3–1.2)
Total Protein: 7.2 g/dL (ref 6.5–8.1)

## 2015-06-16 LAB — CBC WITH DIFFERENTIAL/PLATELET
BASOS ABS: 0 10*3/uL (ref 0.0–0.1)
Basophils Relative: 1 %
EOS ABS: 0.1 10*3/uL (ref 0.0–0.7)
EOS PCT: 1 %
HCT: 48.8 % (ref 39.0–52.0)
Hemoglobin: 17.2 g/dL — ABNORMAL HIGH (ref 13.0–17.0)
Lymphocytes Relative: 29 %
Lymphs Abs: 1.5 10*3/uL (ref 0.7–4.0)
MCH: 34.1 pg — ABNORMAL HIGH (ref 26.0–34.0)
MCHC: 35.2 g/dL (ref 30.0–36.0)
MCV: 96.8 fL (ref 78.0–100.0)
Monocytes Absolute: 0.7 10*3/uL (ref 0.1–1.0)
Monocytes Relative: 12 %
Neutro Abs: 3 10*3/uL (ref 1.7–7.7)
Neutrophils Relative %: 57 %
PLATELETS: 191 10*3/uL (ref 150–400)
RBC: 5.04 MIL/uL (ref 4.22–5.81)
RDW: 12.9 % (ref 11.5–15.5)
WBC: 5.2 10*3/uL (ref 4.0–10.5)

## 2015-06-16 LAB — RAPID URINE DRUG SCREEN, HOSP PERFORMED
Amphetamines: NOT DETECTED
Barbiturates: NOT DETECTED
Benzodiazepines: POSITIVE — AB
COCAINE: NOT DETECTED
OPIATES: NOT DETECTED
TETRAHYDROCANNABINOL: NOT DETECTED

## 2015-06-16 LAB — ETHANOL: ALCOHOL ETHYL (B): 219 mg/dL — AB (ref <5)

## 2015-06-16 LAB — ACETAMINOPHEN LEVEL

## 2015-06-16 NOTE — ED Notes (Signed)
Pt began drinking and taking both xanax and methadone last pm per his report. This behavior continued into today when his SO found him with agonal breathing and unresponsive- pt given 2 narcan intranasally as well as 2 narcan IV with return to regular breathing. He is found to have a party pack of unknown pills as well as straw on his person., and fresh needle tracks to left forearm and AC. He reports that he had a pint today, as well as other alcohol, methadone of unknown amount last night and xanax today. He also smokes 2 packs of cigarettes daily.

## 2015-06-16 NOTE — ED Notes (Signed)
Pt reports that he is ready to go md informed as well as security

## 2015-06-16 NOTE — ED Notes (Signed)
Dr. James at bedside with patient.  

## 2015-06-16 NOTE — ED Notes (Signed)
SO to bedside.

## 2015-06-16 NOTE — ED Notes (Signed)
Pt is discharged per md instruction- Verbalizes understanding of DC instruction, dx, need for further substance abuse care should he choose

## 2015-06-16 NOTE — ED Notes (Signed)
Pt has had supper abnd feels that he is ready to leave- MD informed

## 2015-06-16 NOTE — Discharge Instructions (Signed)
Alcohol Intoxication Alcohol intoxication occurs when you drink enough alcohol that it affects your ability to function. It can be mild or very severe. Drinking a lot of alcohol in a short time is called binge drinking. This can be very harmful. Drinking alcohol can also be more dangerous if you are taking medicines or other drugs. Some of the effects caused by alcohol may include:  Loss of coordination.  Changes in mood and behavior.  Unclear thinking.  Trouble talking (slurred speech).  Throwing up (vomiting).  Confusion.  Slowed breathing.  Twitching and shaking (seizures).  Loss of consciousness. HOME CARE  Do not drive after drinking alcohol.  Drink enough water and fluids to keep your pee (urine) clear or pale yellow. Avoid caffeine.  Only take medicine as told by your doctor. GET HELP IF:  You throw up (vomit) many times.  You do not feel better after a few days.  You frequently have alcohol intoxication. Your doctor can help decide if you should see a substance use treatment counselor. GET HELP RIGHT AWAY IF:  You become shaky when you stop drinking.  You have twitching and shaking.  You throw up blood. It may look bright red or like coffee grounds.  You notice blood in your poop (bowel movements).  You become lightheaded or pass out (faint). MAKE SURE YOU:   Understand these instructions.  Will watch your condition.  Will get help right away if you are not doing well or get worse.   This information is not intended to replace advice given to you by your health care provider. Make sure you discuss any questions you have with your health care provider.   Document Released: 09/29/2007 Document Revised: 12/13/2012 Document Reviewed: 09/15/2012 Elsevier Interactive Patient Education 2016 Elsevier Inc.  Chemical Dependency Chemical dependency is an addiction to drugs or alcohol. It is characterized by the repeated behavior of seeking out and using drugs  and alcohol despite harmful consequences to the health and safety of ones self and others.  RISK FACTORS There are certain situations or behaviors that increase a person's risk for chemical dependency. These include:  A family history of chemical dependency.  A history of mental health issues, including depression and anxiety.  A home environment where drugs and alcohol are easily available to you.  Drug or alcohol use at a young age. SYMPTOMS  The following symptoms can indicate chemical dependency:  Inability to limit the use of drugs or alcohol.  Nausea, sweating, shakiness, and anxiety that occurs when alcohol or drugs are not being used.  An increase in amount of drugs or alcohol that is necessary to get drunk or high. People who experience these symptoms can assess their use of drugs and alcohol by asking themselves the following questions:  Have you been told by friends or family that they are worried about your use of alcohol or drugs?  Do friends and family ever tell you about things you did while drinking alcohol or using drugs that you do not remember?  Do you lie about using alcohol or drugs or about the amounts you use?  Do you have difficulty completing daily tasks unless you use alcohol or drugs?  Is the level of your work or school performance lower because of your drug or alcohol use?  Do you get sick from using drugs or alcohol but keep using anyway?  Do you feel uncomfortable in social situations unless you use alcohol or drugs?  Do you use drugs or alcohol  to help forget problems? An answer of yes to any of these questions may indicate chemical dependency. Professional evaluation is suggested.   This information is not intended to replace advice given to you by your health care provider. Make sure you discuss any questions you have with your health care provider.   Document Released: 04/06/2001 Document Revised: 07/05/2011 Document Reviewed:  06/18/2010 Elsevier Interactive Patient Education 2016 Elsevier Inc.  Opioid Use Disorder Opioid use disorder is a mental disorder. It is the continued nonmedical use of opioids in spite of risks to health and well-being. Misused opioids include the street drug heroin. They also include pain medicines such as morphine, hydrocodone, oxycodone, and fentanyl. Opioids are very addictive. People who misuse opioids get an exaggerated feeling of well-being. Opioid use disorder often disrupts activities at home, work, or school. It may cause mental or physical problems.  A family history of opioid use disorder puts you at higher risk of it. People with opioid use disorder often misuse other drugs or have mental illness such as depression, posttraumatic stress disorder, or antisocial personality disorder. They also are at risk of suicide and death from overdose. SIGNS AND SYMPTOMS  Signs and symptoms of opioid use disorder include:  Use of opioids in larger amounts or over a longer period than intended.  Unsuccessful attempts to cut down or control opioid use.  A lot of time spent obtaining, using, or recovering from the effects of opioids.  A strong desire or urge to use opioids (craving).  Continued use of opioids in spite of major problems at work, school, or home because of use.  Continued use of opioids in spite of relationship problems because of use.  Giving up or cutting down on important life activities because of opioid use.  Use of opioids over and over in situations when it is physically hazardous, such as driving a car.  Continued use of opioids in spite of a physical problem that is likely related to use. Physical problems can include:  Severe constipation.  Poor nutrition.  Infertility.  Tuberculosis.  Aspiration pneumonia.  Infections such as human immunodeficiency virus (HIV) and hepatitis (from injecting opioids).  Continued use of opioids in spite of a mental problem  that is likely related to use. Mental problems can include:  Depression.  Anxiety.  Hallucinations.  Sleep problems.  Loss of sexual function.  Need to use more and more opioids to get the same effect, or lessened effect over time with use of the same amount (tolerance).  Having withdrawal symptoms when opioid use is stopped, or using opioids to reduce or avoid withdrawal symptoms. Withdrawal symptoms include:  Depressed, anxious, or irritable mood.  Nausea, vomiting, diarrhea, or intestinal cramping.  Muscle aches or spasms.  Excessive tearing or runny nose.  Dilated pupils, sweating, or hairs standing on end.  Yawning.  Fever, raised blood pressure, or fast pulse.  Restlessness or trouble sleeping. This does not apply to people taking opioids for medical reasons only. DIAGNOSIS Opioid use disorder is diagnosed by your health care provider. You may be asked questions about your opioid use and and how it affects your life. A physical exam may be done. A drug screen may be ordered. You may be referred to a mental health professional. The diagnosis of opioid use disorder requires at least two symptoms within 12 months. The type of opioid use disorder you have depends on the number of signs and symptoms you have. The type may be:  Mild. Two or  three signs and symptoms.   Moderate. Four or five signs and symptoms.   Severe. Six or more signs and symptoms. TREATMENT  Treatment is usually provided by mental health professionals with training in substance use disorders.The following options are available:  Detoxification.This is the first step in treatment for withdrawal. It is medically supervised withdrawal with the use of medicines. These medicines lessen withdrawal symptoms. They also raise the chance of becoming opioid free.  Counseling, also known as talk therapy. Talk therapy addresses the reasons you use opioids. It also addresses ways to keep you from using again  (relapse). The goals of talk therapy are to avoid relapse by:  Identifying and avoiding triggers for use.  Finding healthy ways to cope with stress.  Learning how to handle cravings.  Support groups. Support groups provide emotional support, advice, and guidance.  A medicine that blocks opioid receptors in your brain. This medicine can reduce opioid cravings that lead to relapse. This medicine also blocks the desired opioid effect when relapse occurs.  Opioids that are taken by mouth in place of the misused opioid (opioid maintenance treatment). These medicines satisfy cravings but are safer than commonly misused opioids. This often is the best option for people who continue to relapse with other treatments. HOME CARE INSTRUCTIONS   Take medicines only as directed by your health care provider.  Check with your health care provider before starting new medicines.  Keep all follow-up visits as directed by your health care provider. SEEK MEDICAL CARE IF:  You are not able to take your medicines as directed.  Your symptoms get worse. SEEK IMMEDIATE MEDICAL CARE IF:  You have serious thoughts about hurting yourself or others.  You may have taken an overdose of opioids. FOR MORE INFORMATION  National Institute on Drug Abuse: http://www.price-smith.com/  Substance Abuse and Mental Health Services Administration: SkateOasis.com.pt   This information is not intended to replace advice given to you by your health care provider. Make sure you discuss any questions you have with your health care provider.    Emergency Department Resource Guide 1) Find a Doctor and Pay Out of Pocket Although you won't have to find out who is covered by your insurance plan, it is a good idea to ask around and get recommendations. You will then need to call the office and see if the doctor you have chosen will accept you as a new patient and what types of options they offer for patients who are self-pay. Some doctors  offer discounts or will set up payment plans for their patients who do not have insurance, but you will need to ask so you aren't surprised when you get to your appointment.  2) Contact Your Local Health Department Not all health departments have doctors that can see patients for sick visits, but many do, so it is worth a call to see if yours does. If you don't know where your local health department is, you can check in your phone book. The CDC also has a tool to help you locate your state's health department, and many state websites also have listings of all of their local health departments.  3) Find a Walk-in Clinic If your illness is not likely to be very severe or complicated, you may want to try a walk in clinic. These are popping up all over the country in pharmacies, drugstores, and shopping centers. They're usually staffed by nurse practitioners or physician assistants that have been trained to treat common illnesses and complaints.  They're usually fairly quick and inexpensive. However, if you have serious medical issues or chronic medical problems, these are probably not your best option.  No Primary Care Doctor: - Call Health Connect at  [phone] - they can help you locate a primary care doctor that  accepts your insurance, provides certain services, etc. - Physician Referral Service- [phone]  Chronic Pain Problems: Organization         Address  Phone   Notes  [facility]  [phone] Patients need to be referred by their primary care doctor.   Medication Assistance: Organization         Address  Phone   Notes  [facility] Medication [facility] [address] [place] [place], [place] [address] [phone] --Must be a resident of [facility] -- Must have NO insurance coverage whatsoever (no Medicaid/ Medicare, etc.) -- The pt. MUST have a primary care doctor that directs their care regularly and follows them in the community    [facility]ist  [phone]   [employer]  [phone]    Agencies that provide inexpensive medical care: Organization         Address  Phone   Notes  [facility]  [phone]   [facility]    [phone]   [facility] [address] [place], [place] [address] [phone]   [facility] [address] [place], [place] [phone]   Planned Parenthood    [phone]   [facility]    [phone]   [facility]  [address], [place] Phone:  [phone], Fax:  [phone] Hours of Operation:  9 am - 6 pm, M-F.  Also accepts Medicaid/Medicare and self-pay.  [facility]  [address], [place] Phone: [phone], Fax: [phone]. Hours of Operation:  8:30 am - 5:30 pm, M-F.  Also accepts Medicaid and self-pay.  [facility] [place] [address], [place] Phone: [phone]   [facility] [address] Natasha Bence [place], [place] [phone], Ext. [phone] Mondays & Thursdays: 7-9 AM.  First 15 patients are seen on a first come, first serve basis.    Medicaid-accepting [facility] Providers:  Organization         Address  Phone   Notes  [facility] [address], [place] [phone] Also accepts self-pay patients.  [facility] [address] Laurell Josephs [place], [place]  [phone]   [facility] [address], [place] [phone]   [facility] [address] [place] [phone]   Renaye Rakers [address], [place]   [phone] Only accepts [place] Access [place] patients after they have their name applied to their card.   Self-Pay (no insurance) in [facility]:  Organization         Address  Phone   Notes  Sickle Cell Patients, [facility]  [facility] [address] [place], [place] [phone]   [facility] [address] [place], [place] [phone]   [facility]  [address], [place] [phone]   [facility]/Dr. Osei-Bonsu  [address], [place] or [address], [place] [phone])  [phone] Phone number for both [facility] and [place] locations is the same.  [facility] [address] [place] [phone]   [facility] [address], [place] or [address] [phone]   [facility] [address] [place] [phone], phone; [phone], fax Sees patients 1st and 3rd Saturday of every month.  Must not qualify for public or private insurance (i.e. Medicaid, Medicare, [place] Health Choice, Veterans' Benefits)  Household income should be no more than 200% of the poverty level The clinic cannot treat you if you are pregnant or think you are pregnant  Sexually transmitted diseases are not treated at the clinic.    Dental Care: Organization         Address  Phone  Notes  [facility] Department of [facility] [address] [place], [place] [phone] Accepts children up to age 20 who are enrolled in [place] or [place] Health Choice; pregnant women with a Medicaid card; and children who have applied for Medicaid or [place] Health Choice, but were declined, whose parents can pay a reduced fee at time of service.  [facility] Department of [facility]  [address], [place] [phone] Accepts children up to age 62 who are enrolled in [place] or [place] Health Choice; pregnant women with a Medicaid card; and children who have applied for Medicaid or [place] Health Choice, but were declined, whose parents can pay a reduced fee at time of service.  [facility]  [address]  [place], [place] [phone] Patients are seen by appointment only. Walk-ins are not accepted. [facility] will see patients 88 years of age and older. Monday - Tuesday (8am-5pm) Most Wednesdays (8:30-5pm) $30 per visit, cash only  [facility]  [address], [facility] [phone] Patients are seen by appointment only. Walk-ins are not accepted. [facility] will see patients 13 years of age and older. One Wednesday Evening (Monthly: Volunteer Based).  $30 per visit, cash only  [employer]  [phone] for adults; Children under age 69, call Graduate Pediatric Dentistry at [phone]. Children aged 53-14, please call [phone] to request a pediatric application.  Dental services are provided in all areas of dental care including fillings, crowns and bridges, complete and partial dentures, implants, gum treatment, root canals, and extractions. Preventive care is also provided. Treatment is provided to both adults and children. Patients are selected via a lottery and there is often a waiting list.   [facility] [address], [place]  [phone] [email or web address]   [facility] [address] [place], [place] [phone], Ext. [phone] Second and Fourth Thursday of each month, opens at 6:30 AM; Clinic ends at 9 AM.  Patients are seen on a first-come first-served basis, and a limited number are seen during each clinic.   [facility]  [address] Ether Griffins [place], [place] [phone]   Eligibility Requirements You must have lived in [place], [place], or [place] counties for at least the last three months.   You cannot be eligible for state or federal sponsored [place], including [employer], [place], or [employer].   You generally cannot be eligible for healthcare insurance through your employer.    How to apply: Eligibility screenings are held every Tuesday and  Wednesday afternoon from  1:00 pm until 4:00 pm. You do not need an appointment for the interview!  [facility] [address] [place], [place] [phone]   [facility] Health Department  [phone]   [facility] Health Department  [phone]   [facility] Health Department  [phone]    Behavioral Health Resources in the Community: Intensive Outpatient Programs Organization         Address  Phone  Notes  [facility] [address] [place], [place] [phone]   [facility] Outpatient [address], [place], [place] [phone]   [facility] [address] [place], [place]  [phone]   [facility] [address]  [place], [place] [phone] or [phone]   Substance Abuse Resources Organization         Address  Phone  Notes  [facility]  [phone]   [facility]  [phone]   [facility]  [phone]   Floydene Flock  [phone]   Residential & Outpatient Substance Abuse Program  [phone]   Psychological Services Organization         Address  Phone  Notes  [facility]  [phone]   [facility]  [phone]   [facility] [address], [place] [phone] or [phone]    Mobile Crisis Teams Organization         Address  Phone  Notes  [facility], Mobile Crisis Care Unit  [phone]   [facility]  [address] [place], [place] [phone]   Doristine Locks [address] [place] [phone]    Self-Help/Support Groups Organization         Address  Phone             Notes  [facility] [place] - variety of support groups  336- [phone] Call for more information  Narcotics Anonymous (NA), [facility] [address], [employer] [place]  2 meetings at this location   Risk manager         Address  Phone  Notes  [facility] [address],    [place]  [phone]   [facility]  [address], [place] [address], [place], [place] [phone]   [facility] Treatment Facility [address] [place], [place] [phone] Admissions: 8am-3pm M-F  [facility] [address],    [place], [place] [phone]   [facility] [address] [place], [place], [place] [phone]   [facility] [address]  [place], [place] [phone]   [facility] - Intensive Outpatient [address] Laurell Josephs [address], [place], [place] [phone]   [facility] ([facility] [address] [place]  [place], [place] [phone] or [phone]   [facility] ([facility]) [address] [place], [place] [phone] Accepts Medicaid  [facility] [address]  [place] [phone] Substance Abuse/Addiction Treatment   [facility] Organization         Address  Phone  Notes  [facility]  [phone]   Angie Fava, PhD [address] Ervin Knack [place], [place]   [phone] or [phone]   [facility] Behavioral   [address] [place], [place] [phone]   [facility] [address], [place], [place] [phone] Insurance/Medicaid/sponsorship through [employer] and Families [address] [place]  [address]                                    [place], [place] [phone] Therapy/tele-psych/case  [facility] [address]   [place], [place] [phone]    Dr. Lolly Mustache  [phone]   [facility]  [employer] [facility] Dept. 1) [address] [place] 2) [address] [place] 3)  [address], [place] [phone]  [phone]   [facility] Child Abuse Hotline 706-032-5526 or [phone] (After Hours)

## 2015-06-16 NOTE — ED Notes (Signed)
Pt party pack found with straw- pt admits to xanax and alcohol. 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

## 2015-06-16 NOTE — ED Provider Notes (Signed)
CSN: [id number]     Arrival date & time 06/16/15  1608 History   First MD Initiated Contact with Patient 06/16/15 1614     No chief complaint on file.     HPI  She presents for evaluation after being found lethargic in his home. Family members found him. He was breathing. However is not verbally responsive. EMS was summoned. His blood sugar was normal. Was given Narcan intranasally an IV and had improvement. Patient had multiple medications with him. States that he took some Opana, last night. He states he "thinks" that it was [employer]. States he sometimes "is given " methadone or Opana for chronic pain.  As he was drinking today as well. Has a prescription he states is from his physician for Xanax.  No past medical history on file. History reviewed. No pertinent past surgical history. History reviewed. No pertinent family history. Social History  Substance Use Topics  . Smoking status: Current Every Day Smoker -- 2.00 packs/day    Types: Cigarettes  . Smokeless tobacco: None  . Alcohol Use: Yes     Comment: 1 pint today as well as others    Review of Systems  Constitutional: Negative for fever, chills, diaphoresis, appetite change and fatigue.  HENT: Negative for mouth sores, sore throat and trouble swallowing.   Eyes: Negative for visual disturbance.  Respiratory: Negative for cough, chest tightness, shortness of breath and wheezing.   Cardiovascular: Negative for chest pain.  Gastrointestinal: Negative for nausea, vomiting, abdominal pain, diarrhea and abdominal distention.  Endocrine: Negative for polydipsia, polyphagia and polyuria.  Genitourinary: Negative for dysuria, frequency and hematuria.  Musculoskeletal: Negative for gait problem.  Skin: Negative for color change, pallor and rash.  Neurological: Negative for dizziness, syncope, light-headedness and headaches.  Hematological: Does not bruise/bleed easily.  Psychiatric/Behavioral: Negative for behavioral problems and  confusion.      Allergies  Review of patient's allergies indicates no known allergies.  Home Medications   Prior to Admission medications   Medication Sig Start Date End Date Taking? Authorizing Provider  acetaminophen (TYLENOL) 500 MG tablet Take 500 mg by mouth every 6 (six) hours as needed for mild pain or moderate pain.   Yes Historical Provider, MD  aspirin EC 325 MG tablet Take 325 mg by mouth 2 (two) times daily as needed for mild pain or moderate pain.   Yes Historical Provider, MD  dimenhyDRINATE (DRAMAMINE) 50 MG tablet Take 50 mg by mouth every 8 (eight) hours as needed for itching or nausea.   Yes Historical Provider, MD   BP 106/80 mmHg  Pulse 107  Temp(Src) 97.8 F (36.6 C) (Oral)  Resp 17  Ht  (1.6 m)  Wt 140 lb (63.504 kg)  BMI 24.81 kg/m2  SpO2 91% Physical Exam  Constitutional: He is oriented to person, place, and time. He appears well-developed and well-nourished. No distress.  HENT:  Head: Normocephalic.  Eyes: Conjunctivae are normal. Pupils are equal, round, and reactive to light. No scleral icterus.  Neck: Normal range of motion. Neck supple. No thyromegaly present.  Cardiovascular: Normal rate and regular rhythm.  Exam reveals no gallop and no friction rub.   No murmur heard. Pulmonary/Chest: Effort normal and breath sounds normal. No respiratory distress. He has no wheezes. He has no rales.  Abdominal: Soft. Bowel sounds are normal. He exhibits no distension. There is no tenderness. There is no rebound.  Musculoskeletal: Normal range of motion.  Neurological: He is alert and oriented to person, place,  and time.  Skin: Skin is warm and dry. No rash noted.  Psychiatric:  Awake alert. He states that he is sad and frequently depressed. However he denies that this was a suicide attempt. He states he uses alcohol then dexamethasone to help with "feeling shaky".    ED Course  Procedures (including critical care time) Labs Review Labs Reviewed   URINE RAPID DRUG SCREEN, HOSP PERFORMED - Abnormal; Notable for the following:    Benzodiazepines POSITIVE (*)    All other components within normal limits  ACETAMINOPHEN LEVEL - Abnormal; Notable for the following:    Acetaminophen (Tylenol), Serum <10 (*)    All other components within normal limits  ETHANOL - Abnormal; Notable for the following:    Alcohol, Ethyl (B) 219 (*)    All other components within normal limits  CBC WITH DIFFERENTIAL/PLATELET - Abnormal; Notable for the following:    Hemoglobin 17.2 (*)    MCH 34.1 (*)    All other components within normal limits  COMPREHENSIVE METABOLIC PANEL - Abnormal; Notable for the following:    Calcium 8.3 (*)    AST 99 (*)    ALT 90 (*)    All other components within normal limits    Imaging Review No results found. I have personally reviewed and evaluated these images and lab results as part of my medical decision-making.   EKG Interpretation None      MDM   Final diagnoses:  Narcotic abuse  Alcohol intoxication, with unspecified complication [facility])    Patient observed appears in the emergency room. Had progressively increasing mental status. Was not sedated upon arrival. Remained awake and alert. I reevaluated him 3 times. He has eaten a sandwich and drinking. He is mandatory to the bathroom. He is stable on his feet.    Rolland Porter, MD 06/16/15 [phone]

## 2016-01-10 ENCOUNTER — Encounter (HOSPITAL_COMMUNITY): Payer: Self-pay | Admitting: Emergency Medicine

## 2016-01-10 ENCOUNTER — Emergency Department (HOSPITAL_COMMUNITY)
Admission: EM | Admit: 2016-01-10 | Discharge: 2016-01-10 | Disposition: A | Discharge status: Home / Self Care | Payer: Self-pay | Attending: Emergency Medicine | Admitting: Emergency Medicine

## 2016-01-10 DIAGNOSIS — F17210 Nicotine dependence, cigarettes, uncomplicated: Secondary | ICD-10-CM | POA: Insufficient documentation

## 2016-01-10 DIAGNOSIS — L089 Local infection of the skin and subcutaneous tissue, unspecified: Secondary | ICD-10-CM | POA: Insufficient documentation

## 2016-01-10 DIAGNOSIS — B958 Unspecified staphylococcus as the cause of diseases classified elsewhere: Secondary | ICD-10-CM

## 2016-01-10 MED ORDER — HYDROCODONE-ACETAMINOPHEN 5-325 MG PO TABS: ORAL_TABLET | ORAL | 0 refills | Status: DC

## 2016-01-10 MED ORDER — HYDROCODONE-ACETAMINOPHEN 5-325 MG PO TABS
1.0000 | ORAL_TABLET | Freq: Once | ORAL | Status: AC
  Administered 2016-01-10: 1 via ORAL
  Filled 2016-01-10: qty 1

## 2016-01-10 MED ORDER — SULFAMETHOXAZOLE-TRIMETHOPRIM 800-160 MG PO TABS
1.0000 | ORAL_TABLET | Freq: Once | ORAL | Status: AC
  Administered 2016-01-10: 1 via ORAL
  Filled 2016-01-10: qty 1

## 2016-01-10 MED ORDER — SULFAMETHOXAZOLE-TRIMETHOPRIM 800-160 MG PO TABS: 1.0000 | ORAL_TABLET | Freq: Two times a day (BID) | ORAL | 0 refills | Status: AC

## 2016-01-10 MED ORDER — MUPIROCIN 2 % EX OINT: TOPICAL_OINTMENT | CUTANEOUS | 0 refills | Status: DC

## 2016-01-10 NOTE — Discharge Instructions (Signed)
Clean the affected areas with mild soap and water.  Follow-up with the skin doctor listed if not improving

## 2016-01-10 NOTE — ED Provider Notes (Signed)
[facility]-EMER[facility] DEPT Provider Note   CSN: [id number] Arrival date & time: 01/10/16  1618     History   Chief Complaint Chief Complaint  Patient presents with  . Rash    HPI Jack Mckenzie is a 50 y.o. male.  HPI   Jack Mckenzie is a 50 y.o. male who presents to the Emergency Department complaining of painful, open sores to both hands, neck and face.  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  He states they begin as "blisters" that open and and leave "open sores" that itch, burn and hurt.  Lesions have now spread to his face, neck and forearms.  He denies swelling, drainage, fever, chills, or new medications.   History reviewed. No pertinent past medical history.  There are no active problems to display for this patient.   History reviewed. No pertinent surgical history.     Home Medications    Prior to Admission medications   Not on File    Family History History reviewed. No pertinent family history.  Social History Social History  Substance Use Topics  . Smoking status: Current Every Day Smoker    Packs/day: 2.00    Types: Cigarettes  . Smokeless tobacco: Never Used  . Alcohol use Yes     Comment: occassional     Allergies   Review of patient's allergies indicates no known allergies.   Review of Systems Review of Systems  Constitutional: Negative for activity change, appetite change, chills and fever.  HENT: Negative for facial swelling, sore throat and trouble swallowing.   Respiratory: Negative for chest tightness, shortness of breath and wheezing.   Musculoskeletal: Negative for neck pain and neck stiffness.  Skin: Positive for rash. Negative for wound.  Neurological: Negative for dizziness, weakness, numbness and headaches.  All other systems reviewed and are negative.    Physical Exam Updated Vital Signs BP 116/80 (BP Location: Left Arm)   Pulse 102    Temp 98.4 F (36.9 C) (Oral)   Resp 20   Ht 5\' 2"  (1.575 m)   Wt 64.4 kg   SpO2 98%   BMI 25.97 kg/m   Physical Exam  Constitutional: He is oriented to person, place, and time. He appears well-developed and well-nourished. No distress.  HENT:  Head: Normocephalic and atraumatic.  Mouth/Throat: Oropharynx is clear and moist.  Neck: Normal range of motion. Neck supple.  Cardiovascular: Normal rate, regular rhythm and intact distal pulses.   No murmur heard. Pulmonary/Chest: Effort normal and breath sounds normal. No respiratory distress.  Musculoskeletal: Normal range of motion. He exhibits no edema or tenderness.  Lymphadenopathy:    He has no cervical adenopathy.  Neurological: He is alert and oriented to person, place, and time. He exhibits normal muscle tone. Coordination normal.  Skin: Skin is warm. Rash noted. There is erythema.  Open, crusted lesions to the bilateral hands, forearms, neck and face.  One serous blister to the left index finger.  Palms spared.  No edema or drainage.  Nursing note and vitals reviewed.    ED Treatments / Results  Labs (all labs ordered are listed, but only abnormal results are displayed) Labs Reviewed - No data to display  EKG  EKG Interpretation None       Radiology No results found.  Procedures Procedures (including critical care time)  Medications Ordered in ED Medications  sulfamethoxazole-trimethoprim (BACTRIM DS,SEPTRA DS) 800-160 MG  per tablet 1 tablet (not administered)  HYDROcodone-acetaminophen (NORCO/VICODIN) 5-325 MG per tablet 1 tablet (not administered)     Initial Impression / Assessment and Plan / ED Course  I have reviewed the triage vital signs and the nursing notes.  Pertinent labs & imaging results that were available during my care of the patient were reviewed by me and considered in my medical decision making (see chart for details).  Clinical Course    NV intact.  Multiple lesions in various stages  of healing.  Likely related to staph.  Will treat with bactrim and bactroban oint.  Referral given for derm  Final Clinical Impressions(s) / ED Diagnoses   Final diagnoses:  Staph skin infection    New Prescriptions New Prescriptions   No medications on file     Pauline Ausammy Glendora Clouatre, PA-C 01/12/16 1255    Eber HongBrian Miller, MD 01/12/16 [id number]

## 2016-01-10 NOTE — ED Triage Notes (Signed)
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

## 2016-04-14 ENCOUNTER — Emergency Department (HOSPITAL_COMMUNITY): Payer: Self-pay

## 2016-04-14 ENCOUNTER — Encounter (HOSPITAL_COMMUNITY): Payer: Self-pay | Admitting: Emergency Medicine

## 2016-04-14 ENCOUNTER — Emergency Department (HOSPITAL_COMMUNITY)
Admission: EM | Admit: 2016-04-14 | Discharge: 2016-04-14 | Disposition: A | Discharge status: Home / Self Care | Payer: Self-pay | Attending: Emergency Medicine | Admitting: Emergency Medicine

## 2016-04-14 DIAGNOSIS — Y939 Activity, unspecified: Secondary | ICD-10-CM | POA: Insufficient documentation

## 2016-04-14 DIAGNOSIS — R21 Rash and other nonspecific skin eruption: Secondary | ICD-10-CM | POA: Insufficient documentation

## 2016-04-14 DIAGNOSIS — T23202A Burn of second degree of left hand, unspecified site, initial encounter: Secondary | ICD-10-CM | POA: Insufficient documentation

## 2016-04-14 DIAGNOSIS — Z23 Encounter for immunization: Secondary | ICD-10-CM | POA: Insufficient documentation

## 2016-04-14 DIAGNOSIS — S2231XA Fracture of one rib, right side, initial encounter for closed fracture: Secondary | ICD-10-CM | POA: Insufficient documentation

## 2016-04-14 DIAGNOSIS — Y999 Unspecified external cause status: Secondary | ICD-10-CM | POA: Insufficient documentation

## 2016-04-14 DIAGNOSIS — X088XXA Exposure to other specified smoke, fire and flames, initial encounter: Secondary | ICD-10-CM | POA: Insufficient documentation

## 2016-04-14 DIAGNOSIS — W1839XA Other fall on same level, initial encounter: Secondary | ICD-10-CM | POA: Insufficient documentation

## 2016-04-14 DIAGNOSIS — T300 Burn of unspecified body region, unspecified degree: Secondary | ICD-10-CM

## 2016-04-14 DIAGNOSIS — F17210 Nicotine dependence, cigarettes, uncomplicated: Secondary | ICD-10-CM | POA: Insufficient documentation

## 2016-04-14 DIAGNOSIS — Y929 Unspecified place or not applicable: Secondary | ICD-10-CM | POA: Insufficient documentation

## 2016-04-14 MED ORDER — HYDROCODONE-ACETAMINOPHEN 5-325 MG PO TABS: 1.0000 | ORAL_TABLET | ORAL | 0 refills | Status: DC | PRN

## 2016-04-14 MED ORDER — HYDROMORPHONE HCL 1 MG/ML IJ SOLN
1.0000 mg | Freq: Once | INTRAMUSCULAR | Status: AC
  Administered 2016-04-14: 1 mg via INTRAMUSCULAR
  Filled 2016-04-14: qty 1

## 2016-04-14 MED ORDER — TETANUS-DIPHTH-ACELL PERTUSSIS 5-2.5-18.5 LF-MCG/0.5 IM SUSP
0.5000 mL | Freq: Once | INTRAMUSCULAR | Status: AC
  Administered 2016-04-14: 0.5 mL via INTRAMUSCULAR
  Filled 2016-04-14: qty 0.5

## 2016-04-14 MED ORDER — PREDNISONE 10 MG PO TABS: ORAL_TABLET | ORAL | 0 refills | Status: AC

## 2016-04-14 MED ORDER — SILVER SULFADIAZINE 1 % EX CREA
TOPICAL_CREAM | Freq: Once | CUTANEOUS | Status: AC
  Administered 2016-04-14: 13:00:00 via TOPICAL
  Filled 2016-04-14: qty 50

## 2016-04-14 MED ORDER — HYDROCODONE-ACETAMINOPHEN 5-325 MG PO TABS: 1.0000 | ORAL_TABLET | Freq: Once | ORAL | Status: DC
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
## 2016-04-14 NOTE — Discharge Instructions (Signed)
You may reapply a layer of the burn cream twice daily after washing the old cream off with soap and water until this burn heals.  Keep covered until healed.  You may take the hydrocodone prescribed for rib pain.  You may also also find motrin (ibuprofen to also help with pain relief.  Use the incentive spirometer as instructed.  I have prescribed a short course of prednisone for the rash on your hands - I suspect you are allergic to a chemical you are coming in contact with on your job.  Follow up as discussed.
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
## 2016-06-22 ENCOUNTER — Emergency Department (HOSPITAL_COMMUNITY)
Admission: EM | Admit: 2016-06-22 | Discharge: 2016-06-22 | Disposition: A | Discharge status: Home / Self Care | Payer: Self-pay | Attending: Emergency Medicine | Admitting: Emergency Medicine

## 2016-06-22 ENCOUNTER — Encounter (HOSPITAL_COMMUNITY): Payer: Self-pay | Admitting: *Deleted

## 2016-06-22 DIAGNOSIS — J069 Acute upper respiratory infection, unspecified: Secondary | ICD-10-CM | POA: Insufficient documentation

## 2016-06-22 DIAGNOSIS — F17210 Nicotine dependence, cigarettes, uncomplicated: Secondary | ICD-10-CM | POA: Insufficient documentation

## 2016-06-22 DIAGNOSIS — Z79899 Other long term (current) drug therapy: Secondary | ICD-10-CM | POA: Insufficient documentation

## 2016-06-22 DIAGNOSIS — Z5321 Procedure and treatment not carried out due to patient leaving prior to being seen by health care provider: Secondary | ICD-10-CM | POA: Insufficient documentation

## 2016-06-22 NOTE — ED Notes (Signed)
Pt's room was found to be empty with gown laying on the bed. Pt assumed to have left AMA.

## 2016-06-22 NOTE — ED Triage Notes (Signed)
Pt comes in with nasal congestion starting 1 week ago. Pt has began having sinus pressure since then. Pt denies any n/v/d. Pt also has a spot that is tender on his head that he wants looked at. NAD noted

## 2017-01-15 ENCOUNTER — Encounter (HOSPITAL_COMMUNITY): Payer: Self-pay | Admitting: Emergency Medicine

## 2017-01-15 ENCOUNTER — Emergency Department (HOSPITAL_COMMUNITY): Payer: Self-pay

## 2017-01-15 ENCOUNTER — Emergency Department (HOSPITAL_COMMUNITY)
Admission: EM | Admit: 2017-01-15 | Discharge: 2017-01-15 | Disposition: A | Discharge status: Home / Self Care | Payer: Self-pay | Attending: Emergency Medicine | Admitting: Emergency Medicine

## 2017-01-15 DIAGNOSIS — W268XXA Contact with other sharp object(s), not elsewhere classified, initial encounter: Secondary | ICD-10-CM | POA: Insufficient documentation

## 2017-01-15 DIAGNOSIS — Z79899 Other long term (current) drug therapy: Secondary | ICD-10-CM | POA: Insufficient documentation

## 2017-01-15 DIAGNOSIS — Z23 Encounter for immunization: Secondary | ICD-10-CM | POA: Insufficient documentation

## 2017-01-15 DIAGNOSIS — Y929 Unspecified place or not applicable: Secondary | ICD-10-CM | POA: Insufficient documentation

## 2017-01-15 DIAGNOSIS — F17210 Nicotine dependence, cigarettes, uncomplicated: Secondary | ICD-10-CM | POA: Insufficient documentation

## 2017-01-15 DIAGNOSIS — Y939 Activity, unspecified: Secondary | ICD-10-CM | POA: Insufficient documentation

## 2017-01-15 DIAGNOSIS — Y999 Unspecified external cause status: Secondary | ICD-10-CM | POA: Insufficient documentation

## 2017-01-15 DIAGNOSIS — S61412A Laceration without foreign body of left hand, initial encounter: Secondary | ICD-10-CM | POA: Insufficient documentation

## 2017-01-15 DIAGNOSIS — T1490XA Injury, unspecified, initial encounter: Secondary | ICD-10-CM

## 2017-01-15 MED ORDER — TETANUS-DIPHTH-ACELL PERTUSSIS 5-2.5-18.5 LF-MCG/0.5 IM SUSP
0.5000 mL | Freq: Once | INTRAMUSCULAR | Status: AC
  Administered 2017-01-15: 0.5 mL via INTRAMUSCULAR
  Filled 2017-01-15: qty 0.5

## 2017-01-15 MED ORDER — HYDROCODONE-ACETAMINOPHEN 5-325 MG PO TABS
2.0000 | ORAL_TABLET | Freq: Once | ORAL | Status: AC
  Administered 2017-01-15: 2 via ORAL
  Filled 2017-01-15: qty 2

## 2017-01-15 MED ORDER — CEPHALEXIN 500 MG PO CAPS
500.0000 mg | ORAL_CAPSULE | Freq: Once | ORAL | Status: AC
  Administered 2017-01-15: 500 mg via ORAL
  Filled 2017-01-15: qty 1

## 2017-01-15 MED ORDER — CEPHALEXIN 500 MG PO CAPS: 500.0000 mg | ORAL_CAPSULE | Freq: Four times a day (QID) | ORAL | 0 refills | Status: AC

## 2017-01-15 MED ORDER — BUPIVACAINE HCL (PF) 0.25 % IJ SOLN
20.0000 mL | Freq: Once | INTRAMUSCULAR | Status: AC
  Administered 2017-01-15: 20 mL
  Filled 2017-01-15: qty 30

## 2017-01-15 MED ORDER — PROMETHAZINE HCL 12.5 MG PO TABS
12.5000 mg | ORAL_TABLET | Freq: Once | ORAL | Status: AC
  Administered 2017-01-15: 12.5 mg via ORAL
  Filled 2017-01-15: qty 1

## 2017-01-15 MED ORDER — HYDROCODONE-ACETAMINOPHEN 5-325 MG PO TABS: 1.0000 | ORAL_TABLET | ORAL | 0 refills | Status: AC | PRN

## 2017-01-15 NOTE — Discharge Instructions (Signed)
The x-ray of your hand is negative for fracture or dislocation. There is a question of a very small splinter could be in the soft tissue. You're being treated with Keflex 4 times daily with food. Use Tylenol or ibuprofen for soreness. May use Norco for more severe soreness. Please keep the wound clean and dry. Have your stitches removed in 7 or 8 days.

## 2017-01-15 NOTE — ED Triage Notes (Signed)
Pt reports cutting a tree with a chainsaw today and the tree flew back and hit him in the left hand causing a laceration.  Bleeding controlled.

## 2017-01-15 NOTE — ED Notes (Signed)
Awaiting lac repair

## 2017-01-15 NOTE — ED Notes (Signed)
Pt reports sawing down a pine tree- tree kicked and hit his hand  L hand soaked in saline to remove debris and blood

## 2017-01-15 NOTE — ED Notes (Signed)
HB in to repair

## 2017-01-15 NOTE — ED Provider Notes (Signed)
[facility]-EMERGENCY DEPT Provider Note   CSN: [id number] Arrival date & time: 01/15/17  1138     History   Chief Complaint Chief Complaint  Patient presents with  . Laceration    HPI Jack Mckenzie is a 51 y.o. male.  Patient is a 50 year old male who presents to the emergency department with a complaint of lacerationo the left hand.  The patient states that he was cutting a tree when the chainsaw kicked back and hit him in the left hand area, he sustained a laceration. He was able to control he bleeding by applying direct pressure. The patient states he has good range of motion of all fingers,  but he is concerned about the cut getting infected and needing stitches. The patient states he is up-to-date on his tetanus status.   The history is provided by the patient.  Laceration      History reviewed. No pertinent past medical history.  There are no active problems to display for this patient.   History reviewed. No pertinent surgical history.     Home Medications    Prior to Admission medications   Medication Sig Start Date End Date Taking? Authorizing Provider  HYDROcodone-acetaminophen (NORCO/VICODIN) 5-325 MG tablet Take 1 tablet by mouth every 4 (four) hours as needed. Patient not taking: Reported on 06/22/2016 04/14/16   Burgess Amor, PA-C  predniSONE (DELTASONE) 10 MG tablet 6, 5, 4, 3, 2 then 1 tablet by mouth daily for 6 days total. Patient not taking: Reported on 06/22/2016 04/14/16   Burgess Amor, PA-C    Family History History reviewed. No pertinent family history.  Social History Social History  Substance Use Topics  . Smoking status: Current Every Day Smoker    Packs/day: 2.00    Types: Cigarettes  . Smokeless tobacco: Never Used  . Alcohol use Yes     Comment: occassional     Allergies   Patient has no known allergies.   Review of Systems Review of Systems  Constitutional: Negative for activity change.       All ROS Neg except as noted in  HPI  HENT: Negative for nosebleeds.   Eyes: Negative for photophobia and discharge.  Respiratory: Negative for cough, shortness of breath and wheezing.   Cardiovascular: Negative for chest pain and palpitations.  Gastrointestinal: Negative for abdominal pain and blood in stool.  Genitourinary: Negative for dysuria, frequency and hematuria.  Musculoskeletal: Negative for arthralgias, back pain and neck pain.       Laceration.  Skin: Negative.   Neurological: Negative for dizziness, seizures and speech difficulty.  Psychiatric/Behavioral: Negative for confusion and hallucinations.     Physical Exam Updated Vital Signs BP 110/69 (BP Location: Right Arm)   Pulse (!) 102   Temp 97.8 F (36.6 C) (Oral)   Resp 18   Ht  (1.6 m)   Wt 61.2 kg (135 lb)   SpO2 98%   BMI 23.91 kg/m   Physical Exam  Constitutional: He is oriented to person, place, and time. He appears well-developed and well-nourished.  Non-toxic appearance.  HENT:  Head: Normocephalic.  Right Ear: Tympanic membrane and external ear normal.  Left Ear: Tympanic membrane and external ear normal.  Eyes: Pupils are equal, round, and reactive to light. EOM and lids are normal.  Neck: Normal range of motion. Neck supple. Carotid bruit is not present.  Cardiovascular: Normal rate, regular rhythm, normal heart sounds, intact distal pulses and normal pulses.   Pulmonary/Chest: Breath sounds normal. No respiratory distress.  Abdominal: Soft. Bowel sounds are normal. There is no tenderness. There is no guarding.  Musculoskeletal: Normal range of motion.       Left hand: He exhibits tenderness and laceration. Normal sensation noted. Normal strength noted.       Hands: Lymphadenopathy:       Head (right side): No submandibular adenopathy present.       Head (left side): No submandibular adenopathy present.    He has no cervical adenopathy.  Neurological: He is alert and oriented to person, place, and time. He has normal  strength. No cranial nerve deficit or sensory deficit.  Skin: Skin is warm and dry.  Psychiatric: He has a normal mood and affect. His speech is normal.  Nursing note and vitals reviewed.    ED Treatments / Results  Labs (all labs ordered are listed, but only abnormal results are displayed) Labs Reviewed - No data to display  EKG  EKG Interpretation None       Radiology No results found.  Procedures .Marland KitchenLaceration Repair Date/Time: 01/15/2017 2:22 PM Performed by: Ivery Quale Authorized by: Ivery Quale   Consent:    Consent obtained:  Verbal   Consent given by:  Patient   Risks discussed:  Infection, pain, poor cosmetic result and poor wound healing Anesthesia (see MAR for exact dosages):    Anesthesia method:  Local infiltration   Local anesthetic:  Bupivacaine 0.25% w/o epi Laceration details:    Location:  Hand   Hand location:  L hand, dorsum   Length (cm):  3.6 Repair type:    Repair type:  Simple Pre-procedure details:    Preparation:  Patient was prepped and draped in usual sterile fashion Exploration:    Hemostasis achieved with:  Direct pressure   Wound extent: no tendon damage noted and no underlying fracture noted   Treatment:    Wound cleansed with: Saf-Cleanse.   Amount of cleaning:  Standard   Irrigation solution:  Sterile saline Skin repair:    Repair method:  Sutures Approximation:    Approximation:  Close Post-procedure details:    Dressing:  Non-adherent dressing   Patient tolerance of procedure:  Tolerated well, no immediate complications   (including critical care time)  Medications Ordered in ED Medications - No data to display   Initial Impression / Assessment and Plan / ED Course  I have reviewed the triage vital signs and the nursing notes.  Pertinent labs & imaging results that were available during my care of the patient were reviewed by me and considered in my medical decision making (see chart for details).        Final Clinical Impressions(s) / ED Diagnoses MDm Vital signs reviewed. X-ray of the hand is negative for fracture or dislocation. There is some soft tissue swelling present there is question of a soft tissue foreign body also present.  The wound was irrigated thoroughly with saline. Wound was repaired with 7 interrupted sutures of 3-0 nylon on. I've instructed the patient to return if any changes or signs of infection. The patient is to have the sutures removed in 7 or 8 days. He has been given tetanus shot to update his status. He and his significant other acknowledge understanding of the instructions.    Final diagnoses:  Laceration of left hand, foreign body presence unspecified, initial encounter    New Prescriptions New Prescriptions   CEPHALEXIN (KEFLEX) 500 MG CAPSULE    Take 1 capsule (500 mg total) by mouth 4 (four) times daily.  HYDROCODONE-ACETAMINOPHEN (NORCO/VICODIN) 5-325 MG TABLET    Take 1-2 tablets by mouth every 4 (four) hours as needed.     Ivery Quale, PA-C 01/15/17 1426    Ivery Quale, PA-C 01/18/17 2142    Donnetta Hutching, MD 01/19/17 [phone]

## 2019-02-04 IMAGING — DX DG HAND COMPLETE 3+V*L*
3 series · 3 of 3 positions shown · non-contrast
Comparison: None.

CLINICAL DATA: Blunt trauma to the medial hand while although
cutting a tree

EXAM:
LEFT HAND - COMPLETE 3+ VIEW

[hand pa (1 of 2)]
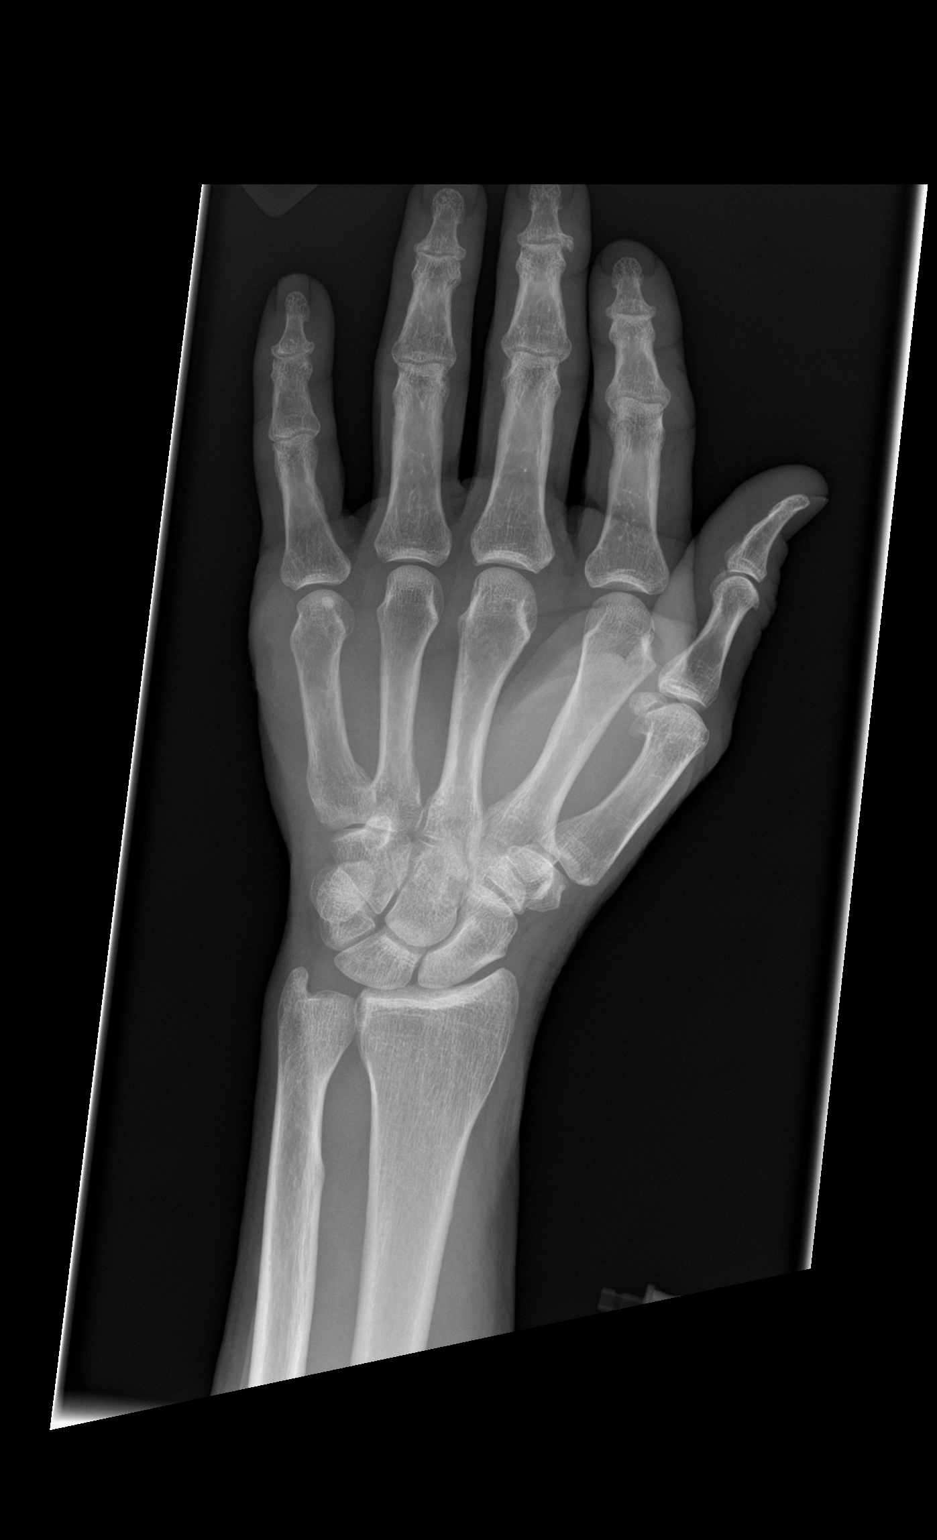

[hand lat]
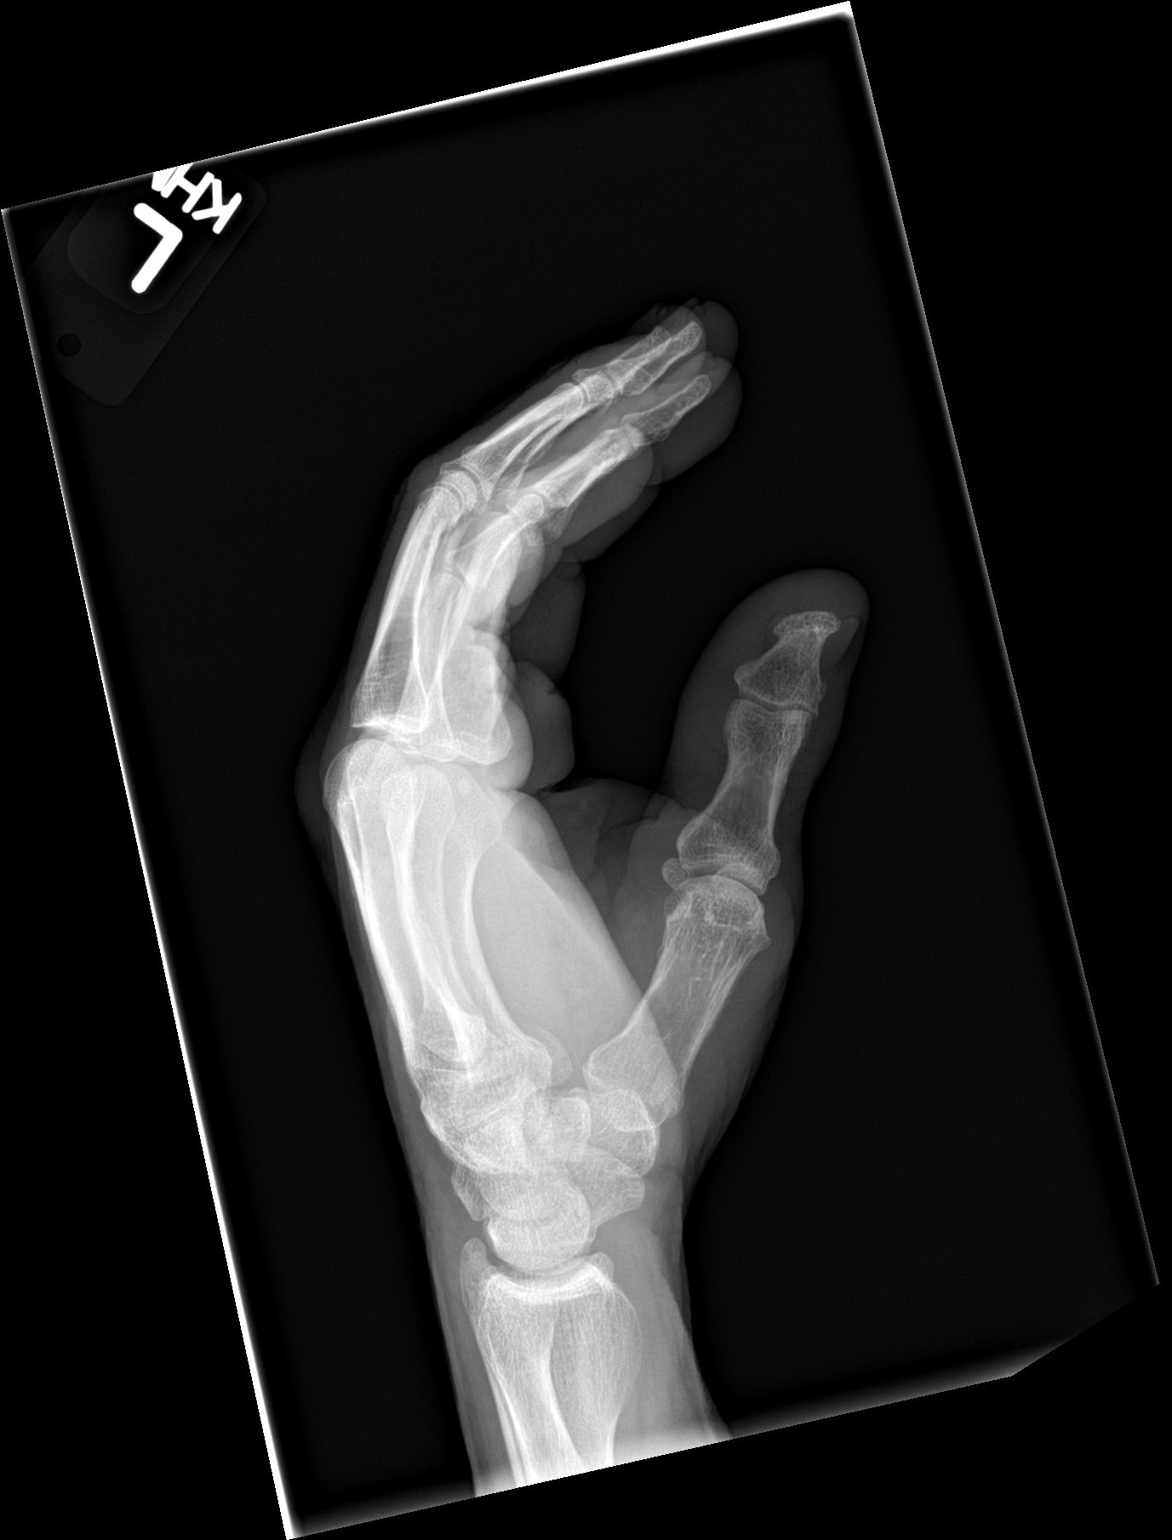

[hand pa (2 of 2)]
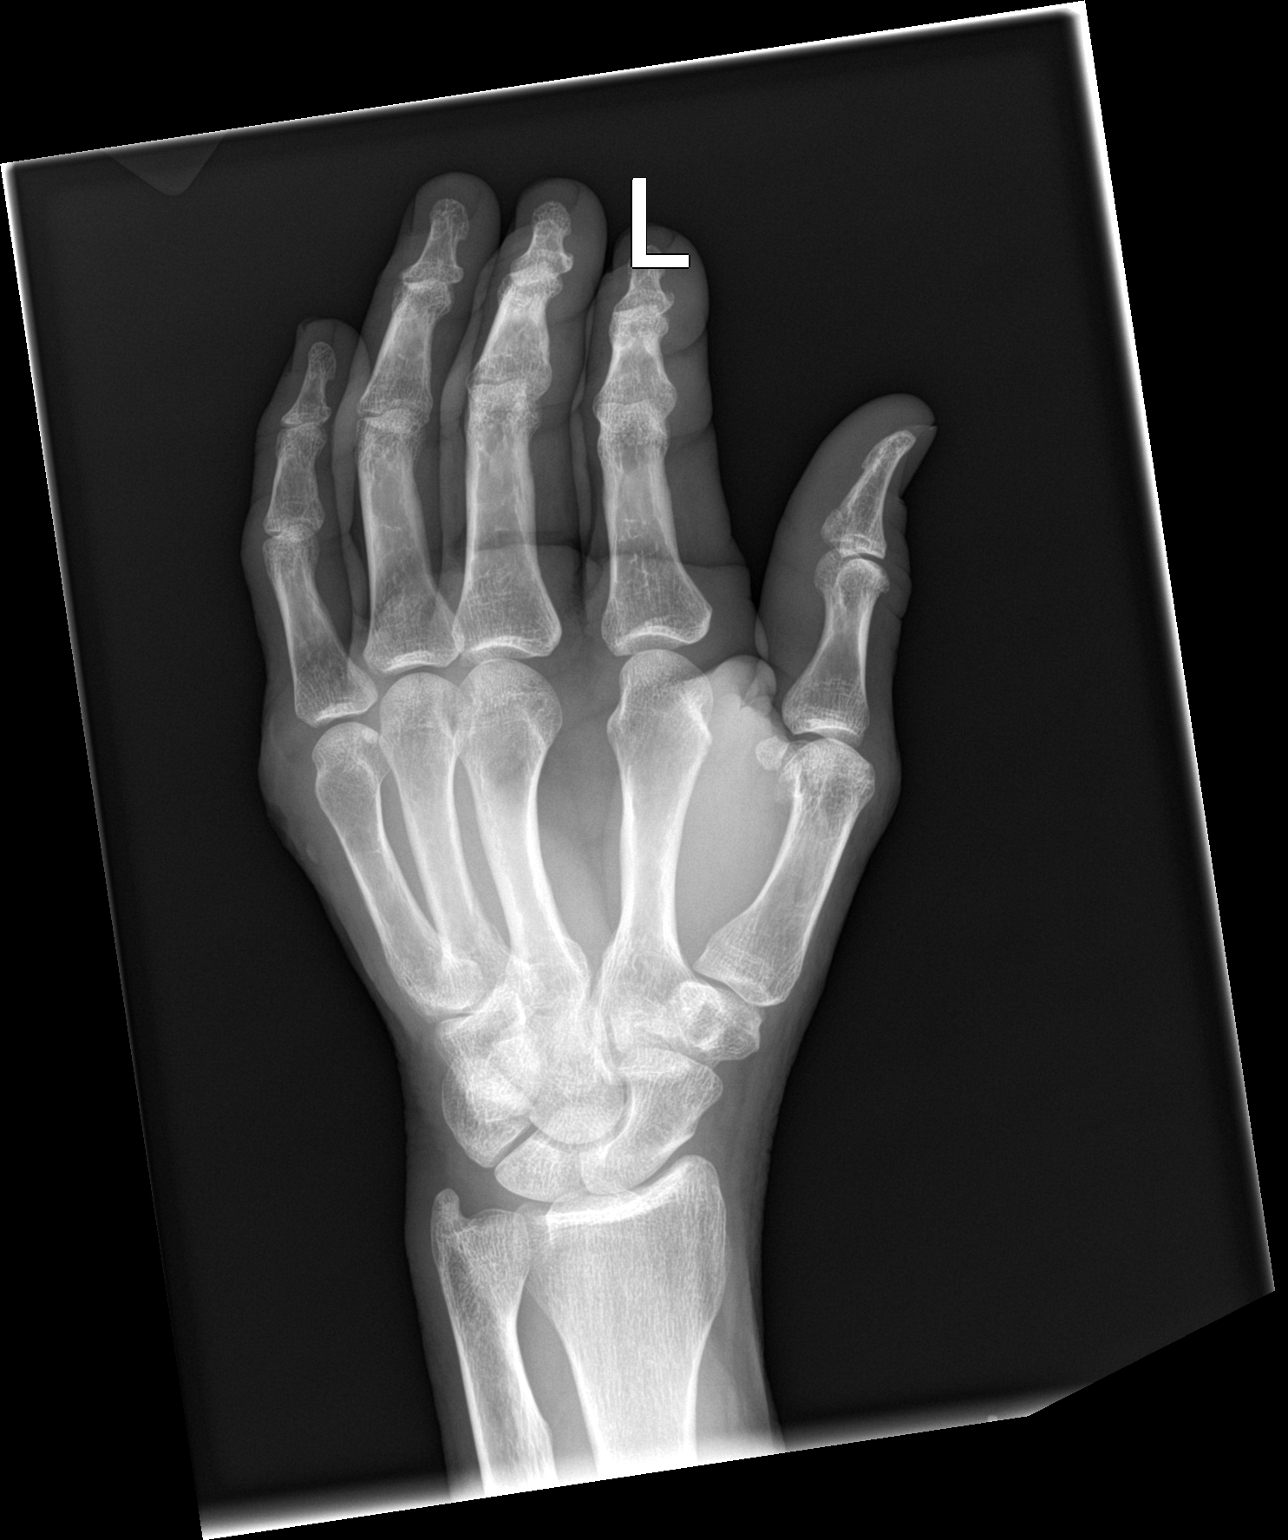

[3 of 3 positions shown; findings below may reference images not displayed]

FINDINGS: Considerable soft tissue swelling is noted medially along the fifth
metacarpal consistent with the recent injury. A small density is
noted within the soft tissues which may represent an acute foreign
body. Clinical correlation is recommended. No acute fracture or
dislocation is seen.
IMPRESSION: Soft tissue injury with possible small foreign body as described. No
acute bony abnormality noted.

## 2019-06-24 ENCOUNTER — Emergency Department (HOSPITAL_COMMUNITY)
Admission: EM | Admit: 2019-06-24 | Discharge: 2019-06-24 | Disposition: A | Discharge status: Home / Self Care | Payer: Self-pay | Attending: Emergency Medicine | Admitting: Emergency Medicine

## 2019-06-24 ENCOUNTER — Other Ambulatory Visit: Payer: Self-pay

## 2019-06-24 DIAGNOSIS — X500XXA Overexertion from strenuous movement or load, initial encounter: Secondary | ICD-10-CM | POA: Insufficient documentation

## 2019-06-24 DIAGNOSIS — R1032 Left lower quadrant pain: Secondary | ICD-10-CM | POA: Insufficient documentation

## 2019-06-24 DIAGNOSIS — F17210 Nicotine dependence, cigarettes, uncomplicated: Secondary | ICD-10-CM | POA: Insufficient documentation

## 2019-06-24 MED ORDER — HYDROMORPHONE HCL 1 MG/ML IJ SOLN
1.0000 mg | Freq: Once | INTRAMUSCULAR | Status: AC
  Administered 2019-06-24: 1 mg via INTRAMUSCULAR
  Filled 2019-06-24: qty 1

## 2019-06-24 MED ORDER — IBUPROFEN 800 MG PO TABS: 800.0000 mg | ORAL_TABLET | Freq: Three times a day (TID) | ORAL | 0 refills | Status: AC | PRN

## 2019-06-24 NOTE — Discharge Instructions (Addendum)
Rest at home for a few days no lifting over 10 pounds until recheck by Dr.

## 2019-06-24 NOTE — ED Triage Notes (Signed)
Patient c/o groin pain without any injury noted. Pain started Monday and has not gotten better. No swelling or discoloration noted per patient. No discharge or burn when he urinates.

## 2019-06-24 NOTE — ED Provider Notes (Signed)
[facility] EMERGENCY DEPARTMENT Provider Note   CSN: [id number] Arrival date & time: 06/24/19  2542     History Chief Complaint  Patient presents with  . Groin Pain    Samuel Mcpeek is a 54 y.o. male.  Patient complains of left groin pain.  Patient does a lot of lifting at work.  The history is provided by the patient. No language interpreter was used.  Groin Pain This is a new problem. The current episode started 2 days ago. The problem occurs constantly. The problem has not changed since onset.Pertinent negatives include no chest pain, no abdominal pain and no headaches. Exacerbated by: Lifting. Nothing relieves the symptoms. He has tried nothing for the symptoms. The treatment provided no relief.       No past medical history on file.  There are no problems to display for this patient.   No past surgical history on file.     No family history on file.  Social History   Tobacco Use  . Smoking status: Current Every Day Smoker    Packs/day: 2.00    Types: Cigarettes  . Smokeless tobacco: Never Used  Substance Use Topics  . Alcohol use: Yes    Comment: occassional  . Drug use: Yes    Types: IV    Comment: opioids, benzos, methadone (denies any currently)    Home Medications Prior to Admission medications   Medication Sig Start Date End Date Taking? Authorizing Provider  cephALEXin (KEFLEX) 500 MG capsule Take 1 capsule (500 mg total) by mouth 4 (four) times daily. Patient not taking: Reported on 06/24/2019 01/15/17   Lily Kocher, PA-C  HYDROcodone-acetaminophen (NORCO/VICODIN) 5-325 MG tablet Take 1-2 tablets by mouth every 4 (four) hours as needed. Patient not taking: Reported on 06/24/2019 01/15/17   Lily Kocher, PA-C  ibuprofen (ADVIL) 800 MG tablet Take 1 tablet (800 mg total) by mouth every 8 (eight) hours as needed (pain). 06/24/19   Milton Ferguson, MD  predniSONE (DELTASONE) 10 MG tablet 6, 5, 4, 3, 2 then 1 tablet by mouth daily for 6 days  total. Patient not taking: Reported on 06/22/2016 04/14/16   Evalee Jefferson, PA-C    Allergies    Patient has no known allergies.  Review of Systems   Review of Systems  Constitutional: Negative for appetite change and fatigue.  HENT: Negative for congestion, ear discharge and sinus pressure.   Eyes: Negative for discharge.  Respiratory: Negative for cough.   Cardiovascular: Negative for chest pain.  Gastrointestinal: Negative for abdominal pain and diarrhea.  Genitourinary: Negative for frequency and hematuria.       Tender left groin  Musculoskeletal: Negative for back pain.  Skin: Negative for rash.  Neurological: Negative for seizures and headaches.  Psychiatric/Behavioral: Negative for hallucinations.    Physical Exam Updated Vital Signs BP 114/84   Pulse 69   Temp 98.3 F (36.8 C) (Oral)   Resp 16   Ht 5\' 2"  (1.575 m)   Wt 61.2 kg   SpO2 98%   BMI 24.69 kg/m   Physical Exam Vitals and nursing note reviewed.  Constitutional:      Appearance: He is well-developed.  HENT:     Head: Normocephalic.     Nose: Nose normal.  Eyes:     General: No scleral icterus.    Conjunctiva/sclera: Conjunctivae normal.  Neck:     Thyroid: No thyromegaly.  Cardiovascular:     Rate and Rhythm: Normal rate and regular rhythm.  Heart sounds: No murmur. No friction rub. No gallop.   Pulmonary:     Breath sounds: No stridor. No wheezing or rales.  Chest:     Chest wall: No tenderness.  Abdominal:     General: There is no distension.     Tenderness: There is no abdominal tenderness. There is no rebound.  Genitourinary:    Comments: Tender left groin.  No obvious hernia Musculoskeletal:        General: Normal range of motion.     Cervical back: Neck supple.  Lymphadenopathy:     Cervical: No cervical adenopathy.  Skin:    Findings: No erythema or rash.  Neurological:     Mental Status: He is alert and oriented to person, place, and time.     Motor: No abnormal muscle  tone.     Coordination: Coordination normal.  Psychiatric:        Behavior: Behavior normal.     ED Results / Procedures / Treatments   Labs (all labs ordered are listed, but only abnormal results are displayed) Labs Reviewed - No data to display  EKG None  Radiology No results found.  Procedures Procedures (including critical care time)  Medications Ordered in ED Medications  HYDROmorphone (DILAUDID) injection 1 mg (1 mg Intramuscular Given 06/24/19 1052)    ED Course  I have reviewed the triage vital signs and the nursing notes.  Pertinent labs & imaging results that were available during my care of the patient were reviewed by me and considered in my medical decision making (see chart for details).    MDM Rules/Calculators/A&P                      Patient with left groin strain.  Patient will be placed on Motrin and is referred to general surgery for recheck Final Clinical Impression(s) / ED Diagnoses Final diagnoses:  Groin discomfort, left    Rx / DC Orders ED Discharge Orders         Ordered    ibuprofen (ADVIL) 800 MG tablet  Every 8 hours PRN     06/24/19 1142           Bethann Berkshire, MD 06/24/19 1150

## 2019-07-24 ENCOUNTER — Other Ambulatory Visit: Payer: Self-pay

## 2019-07-24 ENCOUNTER — Encounter (HOSPITAL_COMMUNITY): Payer: Self-pay | Admitting: Emergency Medicine

## 2019-07-24 ENCOUNTER — Emergency Department (HOSPITAL_COMMUNITY)
Admission: EM | Admit: 2019-07-24 | Discharge: 2019-07-24 | Disposition: A | Discharge status: Home / Self Care | Payer: Self-pay | Attending: Emergency Medicine | Admitting: Emergency Medicine

## 2019-07-24 ENCOUNTER — Emergency Department (HOSPITAL_COMMUNITY): Payer: Self-pay

## 2019-07-24 DIAGNOSIS — F10920 Alcohol use, unspecified with intoxication, uncomplicated: Secondary | ICD-10-CM | POA: Insufficient documentation

## 2019-07-24 DIAGNOSIS — T401X1A Poisoning by heroin, accidental (unintentional), initial encounter: Secondary | ICD-10-CM | POA: Insufficient documentation

## 2019-07-24 DIAGNOSIS — F17210 Nicotine dependence, cigarettes, uncomplicated: Secondary | ICD-10-CM | POA: Insufficient documentation

## 2019-07-24 DIAGNOSIS — F1910 Other psychoactive substance abuse, uncomplicated: Secondary | ICD-10-CM | POA: Insufficient documentation

## 2019-07-24 DIAGNOSIS — F1110 Opioid abuse, uncomplicated: Secondary | ICD-10-CM | POA: Insufficient documentation

## 2019-07-24 DIAGNOSIS — F1310 Sedative, hypnotic or anxiolytic abuse, uncomplicated: Secondary | ICD-10-CM | POA: Insufficient documentation

## 2019-07-24 DIAGNOSIS — R4182 Altered mental status, unspecified: Secondary | ICD-10-CM | POA: Insufficient documentation

## 2019-07-24 DIAGNOSIS — R0789 Other chest pain: Secondary | ICD-10-CM | POA: Insufficient documentation

## 2019-07-24 LAB — COMPREHENSIVE METABOLIC PANEL
ALT: 167 U/L — ABNORMAL HIGH (ref 0–44)
AST: 181 U/L — ABNORMAL HIGH (ref 15–41)
Albumin: 3.8 g/dL (ref 3.5–5.0)
Alkaline Phosphatase: 76 U/L (ref 38–126)
Anion gap: 7 (ref 5–15)
BUN: 10 mg/dL (ref 6–20)
CO2: 30 mmol/L (ref 22–32)
Calcium: 8.6 mg/dL — ABNORMAL LOW (ref 8.9–10.3)
Chloride: 101 mmol/L (ref 98–111)
Creatinine, Ser: 0.75 mg/dL (ref 0.61–1.24)
GFR calc Af Amer: >60 mL/min (ref >60)
GFR calc non Af Amer: >60 mL/min (ref >60)
Glucose, Bld: 119 mg/dL — ABNORMAL HIGH (ref 70–99)
Potassium: 4.1 mmol/L (ref 3.5–5.1)
Sodium: 138 mmol/L (ref 135–145)
Total Bilirubin: 0.6 mg/dL (ref 0.3–1.2)
Total Protein: 7.9 g/dL (ref 6.5–8.1)

## 2019-07-24 LAB — CBC WITH DIFFERENTIAL/PLATELET
Abs Immature Granulocytes: 0.09 10*3/uL — ABNORMAL HIGH (ref 0.00–0.07)
Basophils Absolute: 0.1 10*3/uL (ref 0.0–0.1)
Basophils Relative: 1 %
Eosinophils Absolute: 0.1 10*3/uL (ref 0.0–0.5)
Eosinophils Relative: 1 %
HCT: 49.2 % (ref 39.0–52.0)
Hemoglobin: 16.4 g/dL (ref 13.0–17.0)
Immature Granulocytes: 1 %
Lymphocytes Relative: 25 %
Lymphs Abs: 2.3 10*3/uL (ref 0.7–4.0)
MCH: 34.5 pg — ABNORMAL HIGH (ref 26.0–34.0)
MCHC: 33.3 g/dL (ref 30.0–36.0)
MCV: 103.4 fL — ABNORMAL HIGH (ref 80.0–100.0)
Monocytes Absolute: 0.9 10*3/uL (ref 0.1–1.0)
Monocytes Relative: 10 %
Neutro Abs: 5.7 10*3/uL (ref 1.7–7.7)
Neutrophils Relative %: 62 %
Platelets: 190 10*3/uL (ref 150–400)
RBC: 4.76 MIL/uL (ref 4.22–5.81)
RDW: 12.1 % (ref 11.5–15.5)
WBC: 9.2 10*3/uL (ref 4.0–10.5)
nRBC: 0 % (ref 0.0–0.2)

## 2019-07-24 LAB — ACETAMINOPHEN LEVEL: Acetaminophen (Tylenol), Serum: <10 ug/mL — ABNORMAL LOW (ref 10–30)

## 2019-07-24 LAB — TROPONIN I (HIGH SENSITIVITY)
Troponin I (High Sensitivity): 6 ng/L (ref <18)
Troponin I (High Sensitivity): 6 ng/L (ref <18)

## 2019-07-24 LAB — SALICYLATE LEVEL: Salicylate Lvl: <7 mg/dL — ABNORMAL LOW (ref 7.0–30.0)

## 2019-07-24 LAB — ETHANOL: Alcohol, Ethyl (B): 71 mg/dL — ABNORMAL HIGH (ref <10)

## 2019-07-24 MED ORDER — NALOXONE HCL 2 MG/2ML IJ SOSY
1.0000 mg | PREFILLED_SYRINGE | Freq: Once | INTRAMUSCULAR | Status: AC
  Administered 2019-07-24: 1 mg via INTRAVENOUS

## 2019-07-24 MED ORDER — NALOXONE HCL 2 MG/2ML IJ SOSY
PREFILLED_SYRINGE | INTRAMUSCULAR | Status: AC
  Filled 2019-07-24: qty 2

## 2019-07-24 NOTE — ED Provider Notes (Signed)
7:50 AM-checkout from Dr. Lajean Saver to evaluate for coverage sensorium.   Patient Vitals for the past 24 hrs:  BP Temp Pulse Resp SpO2 Height Weight  07/24/19 0930 115/68 -- 85 13 97 % -- --  07/24/19 0900 119/89 -- 90 11 98 % -- --  07/24/19 0815 -- -- 94 16 97 % -- --  07/24/19 0730 112/81 -- 89 -- 90 % -- --  07/24/19 0715 -- -- 88 -- 95 % -- --  07/24/19 0700 110/78 -- 91 -- 96 % -- --  07/24/19 0630 103/74 -- 88 -- 99 % -- --  07/24/19 0615 -- -- 95 -- 97 % -- --  07/24/19 0600 108/62 -- 87 -- 96 % -- --  07/24/19 0445 -- -- -- 13 -- -- --  07/24/19 0430 118/79 -- 79 -- 100 % -- --  07/24/19 0400 106/73 -- 75 12 100 % -- --  07/24/19 0345 -- -- 79 13 100 % -- --  07/24/19 0300 91/62 -- 90 (!) 9 100 % -- --  07/24/19 0256 -- -- 95 18 100 % -- --  07/24/19 0245 -- -- 97 20 100 % -- --  07/24/19 0230 107/75 -- 94 12 90 % -- --  07/24/19 0213 -- 97.9 F (36.6 C) -- -- -- 5\' 2"  (1.575 m) 68.9 kg    10:55 AM Reevaluation with update and discussion. After initial assessment and treatment, an updated evaluation reveals patient is sleeping but arouses easily and states he is "ready to go home.  Findings discussed and questions answered.   Medical Decision Making: Patient with unresponsiveness, in the field, treated with bystander CPR and EMS gave Narcan.  Ongoing sedation likely secondary to alcohol use.  Delta troponin and EKG are reassuring.  No indication for hospitalization at this time. Jack Mckenzie was evaluated in Emergency Department on 07/24/2019 for the symptoms described in the history of present illness. He was evaluated in the context of the global COVID-19 pandemic, which necessitated consideration that the patient might be at risk for infection with the SARS-CoV-2 virus that causes COVID-19. Institutional protocols and algorithms that pertain to the evaluation of patients at risk for COVID-19 are in a state of rapid change based on information released by  regulatory bodies including the CDC and federal and state organizations. These policies and algorithms were followed during the patient's care in the ED.   CRITICAL CARE- No Performed by: 07/26/2019   Nursing Notes Reviewed/ Care Coordinated Applicable Imaging Reviewed Interpretation of Laboratory Data incorporated into ED treatment  The patient appears reasonably screened and/or stabilized for discharge and I doubt any other medical condition or other [facility] requiring further screening, evaluation, or treatment in the ED at this time prior to discharge.  Plan: Home Medications-Tylenol if needed for chest discomfort; Home Treatments-avoid narcotics and alcohol; return here if the recommended treatment, does not improve the symptoms; Recommended follow up-PCP, as needed      [facility], MD 07/24/19 1100

## 2019-07-24 NOTE — ED Provider Notes (Signed)
[facility] EMERGENCY DEPARTMENT Provider Note   CSN: [id number] Arrival date & time: 07/24/19  5361     History Chief Complaint  Patient presents with  . Drug Overdose    Jack Mckenzie is a 54 y.o. male.  Level 5 caveat for altered mental status.  Patient brought in by EMS after apparent heroin overdose.  Reportedly snorted heroin and drank alcohol today.  Did receive bystander CPR but never lost pulses.  On arrival patient is somnolent but arousable and answers questions appropriately.  He does admit to drinking alcohol tonight and using heroin.  States he started it for the first time in "a long time".  Denies any other drug use. Denies any chest pain, abdominal pain, headache or shortness of breath. Denies any other medical history.  The history is provided by the patient and the EMS personnel. The history is limited by the condition of the patient.  Drug Overdose       History reviewed. No pertinent past medical history.  There are no problems to display for this patient.   History reviewed. No pertinent surgical history.     No family history on file.  Social History   Tobacco Use  . Smoking status: Current Every Day Smoker    Packs/day: 2.00    Types: Cigarettes  . Smokeless tobacco: Never Used  Substance Use Topics  . Alcohol use: Yes    Comment: occassional  . Drug use: Yes    Types: IV    Comment: opioids, benzos, methadone (denies any currently)    Home Medications Prior to Admission medications   Medication Sig Start Date End Date Taking? Authorizing Provider  cephALEXin (KEFLEX) 500 MG capsule Take 1 capsule (500 mg total) by mouth 4 (four) times daily. Patient not taking: Reported on 06/24/2019 01/15/17   Ivery Quale, PA-C  HYDROcodone-acetaminophen (NORCO/VICODIN) 5-325 MG tablet Take 1-2 tablets by mouth every 4 (four) hours as needed. Patient not taking: Reported on 06/24/2019 01/15/17   Ivery Quale, PA-C  ibuprofen (ADVIL) 800 MG tablet  Take 1 tablet (800 mg total) by mouth every 8 (eight) hours as needed (pain). 06/24/19   Bethann Berkshire, MD  predniSONE (DELTASONE) 10 MG tablet 6, 5, 4, 3, 2 then 1 tablet by mouth daily for 6 days total. Patient not taking: Reported on 06/22/2016 04/14/16   Burgess Amor, PA-C    Allergies    Patient has no known allergies.  Review of Systems   Review of Systems  Unable to perform ROS: Mental status change    Physical Exam Updated Vital Signs BP 107/75   Pulse 94   Temp 97.9 F (36.6 C)   Resp 12   Ht 5\' 2"  (1.575 m)   Wt 68.9 kg   SpO2 90%   BMI 27.80 kg/m   Physical Exam Vitals and nursing note reviewed.  Constitutional:      General: He is not in acute distress.    Appearance: He is well-developed.     Comments: Somnolent but arousable to voice  HENT:     Head: Normocephalic and atraumatic.     Mouth/Throat:     Pharynx: No oropharyngeal exudate.  Eyes:     Conjunctiva/sclera: Conjunctivae normal.     Pupils: Pupils are equal, round, and reactive to light.     Comments: Pinpoint pupils  Neck:     Comments: No meningismus. Cardiovascular:     Rate and Rhythm: Normal rate and regular rhythm.     Heart sounds:  Normal heart sounds. No murmur.  Pulmonary:     Effort: Pulmonary effort is normal. No respiratory distress.     Breath sounds: Normal breath sounds.  Abdominal:     Palpations: Abdomen is soft.     Tenderness: There is no abdominal tenderness. There is no guarding or rebound.  Musculoskeletal:        General: No tenderness. Normal range of motion.     Cervical back: Normal range of motion and neck supple.  Skin:    General: Skin is warm.  Neurological:     Mental Status: He is alert.     Cranial Nerves: No cranial nerve deficit.     Motor: No abnormal muscle tone.     Coordination: Coordination normal.     Comments: Oriented to person and place, moves all extremities  Psychiatric:        Behavior: Behavior normal.     ED Results / Procedures /  Treatments   Labs (all labs ordered are listed, but only abnormal results are displayed) Labs Reviewed  CBC WITH DIFFERENTIAL/PLATELET - Abnormal; Notable for the following components:      Result Value   MCV 103.4 (*)    MCH 34.5 (*)    Abs Immature Granulocytes 0.09 (*)    All other components within normal limits  COMPREHENSIVE METABOLIC PANEL - Abnormal; Notable for the following components:   Glucose, Bld 119 (*)    Calcium 8.6 (*)    AST 181 (*)    ALT 167 (*)    All other components within normal limits  ETHANOL - Abnormal; Notable for the following components:   Alcohol, Ethyl (B) 71 (*)    All other components within normal limits  ACETAMINOPHEN LEVEL - Abnormal; Notable for the following components:   Acetaminophen (Tylenol), Serum <10 (*)    All other components within normal limits  SALICYLATE LEVEL - Abnormal; Notable for the following components:   Salicylate Lvl <7.0 (*)    All other components within normal limits  RAPID URINE DRUG SCREEN, HOSP PERFORMED  TROPONIN I (HIGH SENSITIVITY)    EKG EKG Interpretation  Date/Time:  Tuesday July 24 2019 02:27:35 EDT Ventricular Rate:  97 PR Interval:    QRS Duration: 95 QT Interval:  370 QTC Calculation: 470 R Axis:   88 Text Interpretation: Sinus rhythm Right atrial enlargement Minimal ST elevation, anterior leads Baseline wander in lead(s) V1 No significant change was found Confirmed by Glynn Octave [phone]) on 07/24/2019 2:35:48 AM   Radiology DG Chest Portable 1 View  Result Date: 07/24/2019 CLINICAL DATA:  Drug overdose EXAM: PORTABLE CHEST 1 VIEW COMPARISON:  03/11/2019 FINDINGS: The heart size and mediastinal contours are within normal limits. Both lungs are clear. The visualized skeletal structures are unremarkable. IMPRESSION: No active disease. Electronically Signed   By: Deatra Robinson M.D.   On: 07/24/2019 03:00    Procedures .Critical Care Performed by: Glynn Octave, MD Authorized by:  Glynn Octave, MD   Critical care provider statement:    Critical care time (minutes):  45   Critical care was necessary to treat or prevent imminent or life-threatening deterioration of the following conditions:  Respiratory failure   Critical care was time spent personally by me on the following activities:  Discussions with consultants, evaluation of patient's response to treatment, examination of patient, ordering and performing treatments and interventions, ordering and review of laboratory studies, ordering and review of radiographic studies, pulse oximetry, re-evaluation of patient's condition, obtaining history from  patient or surrogate and review of old charts   (including critical care time)  Medications Ordered in ED Medications - No data to display  ED Course  I have reviewed the triage vital signs and the nursing notes.  Pertinent labs & imaging results that were available during my care of the patient were reviewed by me and considered in my medical decision making (see chart for details).    MDM Rules/Calculators/A&P                     Suspected heroin overdose received Narcan.  He is arousable and awake on arrival.  Denies suicide attempt.  Labs reassuring.  Mild transaminitis likely secondary to alcohol abuse.  Patient's respiratory rate did decrease around 3:30 AM and did receive additional dose of Narcan.  Recheck 6:30 AM.  Patient is awake and alert.  He is breathing comfortably on room air.  He denies suicidal intent.  He is complaining of some central chest pain.  He reportedly received CPR by bystanders we will repeat EKG and obtain troponin.  His chest x-ray is negative.  Discussed with patient to cease heroin use.  Suspect chest pain is likely due to bystander CPR.  Is reproducible.  Repeat EKG will be obtained and troponin obtained.  Continue to observe patient in the ED for sobriety.  Dr. Eulis Foster to assume care at shift change. Final Clinical  Impression(s) / ED Diagnoses Final diagnoses:  Accidental overdose of heroin, initial encounter [facility])  Atypical chest pain    Rx / DC Orders ED Discharge Orders    None       Fernand Sorbello, Annie Main, MD 07/24/19 [phone]

## 2019-07-24 NOTE — ED Notes (Signed)
ED Provider at bedside. 

## 2019-07-24 NOTE — ED Notes (Signed)
Pt decreased resp rate. Pt given 1mg  narcan. Pt promptly woke up

## 2019-07-24 NOTE — ED Notes (Signed)
Patient aroused  and given ice water

## 2019-07-24 NOTE — Discharge Instructions (Addendum)
Stop using heroin and other illicit drugs.  Establish care with a primary doctor. Return to the ED with new or worsening symptoms including chest pain or shortness of breath or other concerns.

## 2019-07-24 NOTE — ED Triage Notes (Signed)
[facility] called out for heroin overdose and was given narcan 1.2mg .

## 2019-07-24 NOTE — ED Notes (Signed)
Pt states he snorted heroin. Hasn't used it "in a while" and has drank "Quite a bit" today. Denies any other drug use

## 2019-07-24 NOTE — ED Notes (Signed)
xr in room  

## 2021-08-12 IMAGING — DX DG CHEST 1V PORT
1 series · 1 of 1 positions shown · non-contrast
Comparison: 03/11/2019

CLINICAL DATA: Drug overdose

EXAM:
PORTABLE CHEST 1 VIEW

[chest ap]
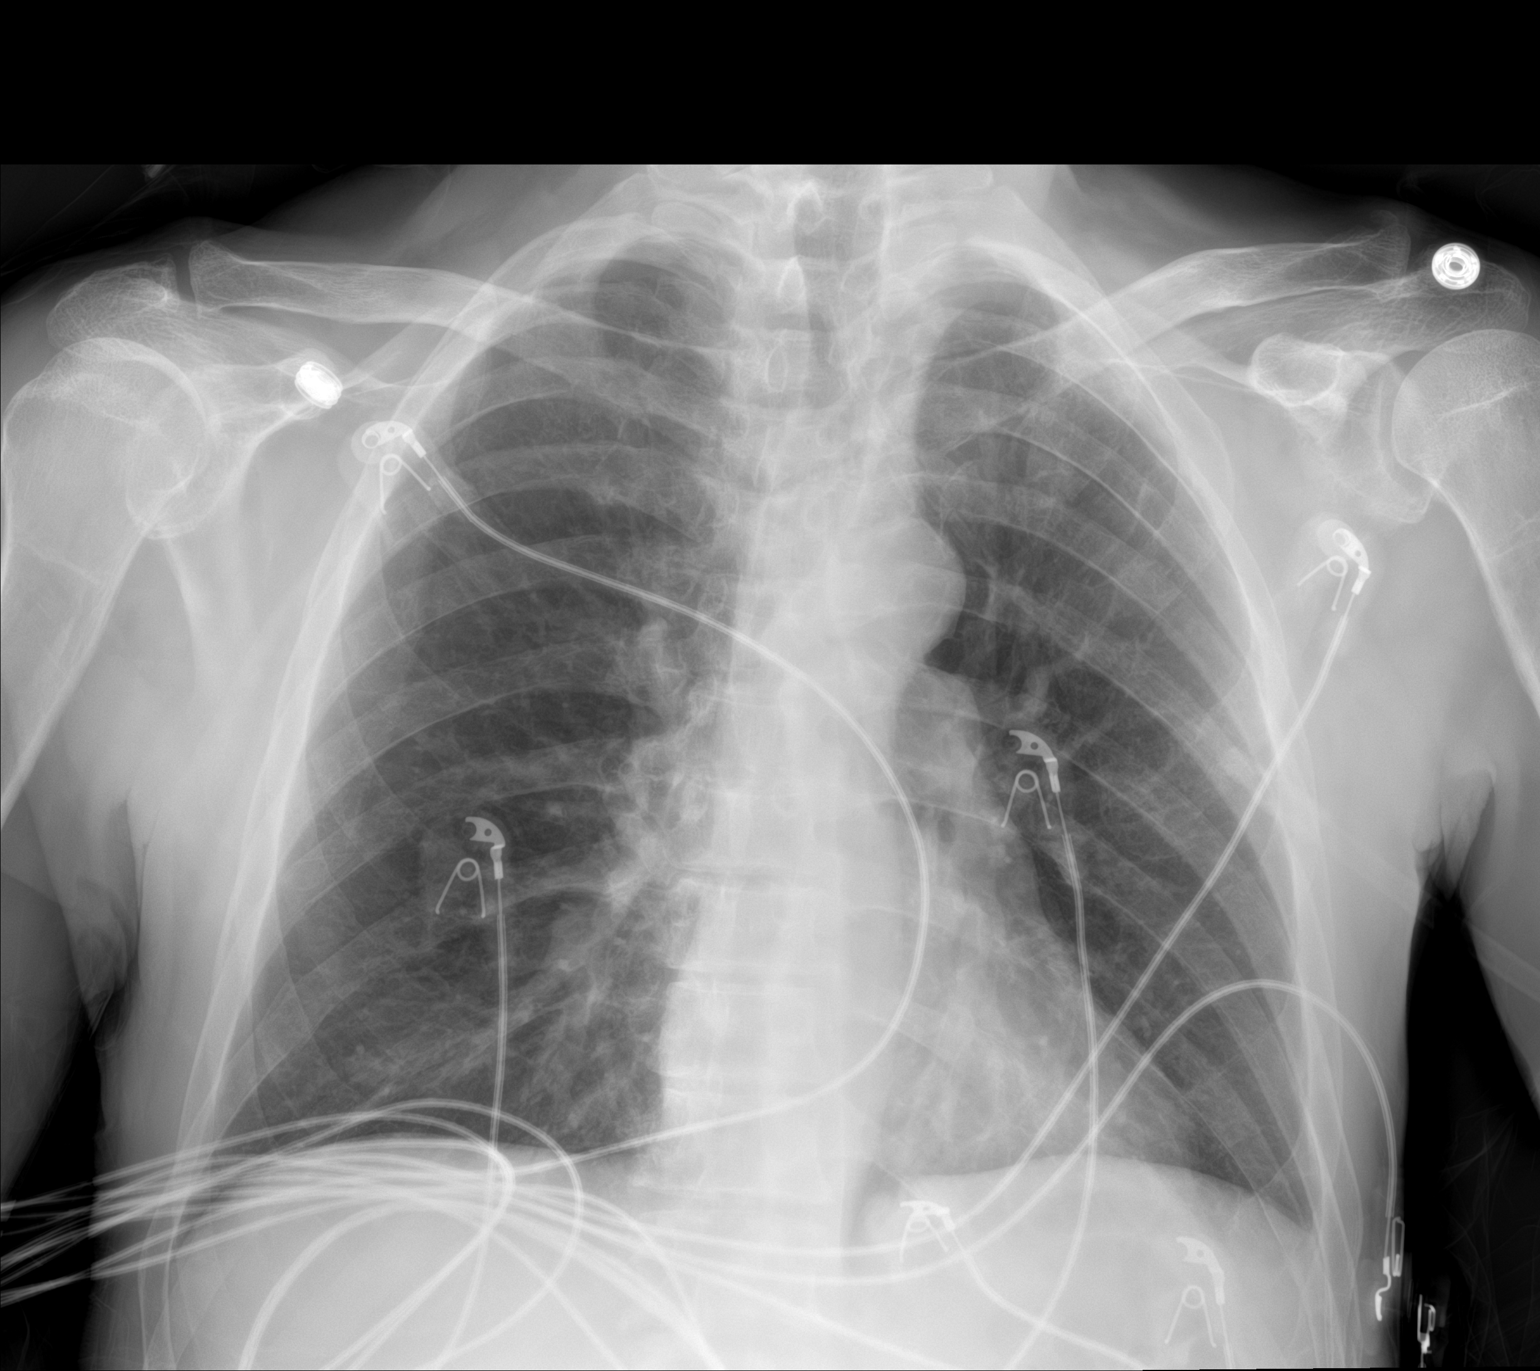

[1 of 1 positions shown; findings below may reference images not displayed]

FINDINGS: The heart size and mediastinal contours are within normal limits.
Both lungs are clear. The visualized skeletal structures are
unremarkable.
IMPRESSION: No active disease.

## 2022-08-16 ENCOUNTER — Ambulatory Visit: Payer: Self-pay | Admitting: Family Medicine
